# Patient Record
Sex: Male | Born: 1965 | Hispanic: No | Smoking: Former smoker
Health system: Southern US, Community
[De-identification: ages and names within clinical notes are randomized; demographics above are authoritative.]

## PROBLEM LIST (undated history)

## (undated) DIAGNOSIS — E781 Pure hyperglyceridemia: Secondary | ICD-10-CM

## (undated) HISTORY — DX: Pure hyperglyceridemia: E78.1

---

## 1999-11-11 ENCOUNTER — Encounter: Payer: Self-pay | Admitting: Emergency Medicine

## 1999-11-11 ENCOUNTER — Emergency Department (HOSPITAL_COMMUNITY): Admission: EM | Admit: 1999-11-11 | Discharge: 1999-11-11 | Payer: Self-pay | Admitting: Emergency Medicine

## 2004-05-29 ENCOUNTER — Ambulatory Visit (HOSPITAL_COMMUNITY): Admission: RE | Admit: 2004-05-29 | Discharge: 2004-05-29 | Payer: Self-pay | Admitting: General Practice

## 2014-10-15 ENCOUNTER — Other Ambulatory Visit: Payer: Self-pay | Admitting: Orthopedic Surgery

## 2014-10-31 ENCOUNTER — Ambulatory Visit (HOSPITAL_BASED_OUTPATIENT_CLINIC_OR_DEPARTMENT_OTHER)
Admission: RE | Admit: 2014-10-31 | Payer: BLUE CROSS/BLUE SHIELD | Source: Ambulatory Visit | Admitting: Orthopedic Surgery

## 2014-10-31 ENCOUNTER — Encounter (HOSPITAL_BASED_OUTPATIENT_CLINIC_OR_DEPARTMENT_OTHER): Admission: RE | Payer: Self-pay | Source: Ambulatory Visit

## 2014-10-31 SURGERY — EXCISION MASS
Anesthesia: General | Site: Finger | Laterality: Right

## 2015-10-21 ENCOUNTER — Ambulatory Visit (INDEPENDENT_AMBULATORY_CARE_PROVIDER_SITE_OTHER): Payer: BLUE CROSS/BLUE SHIELD

## 2015-10-21 ENCOUNTER — Ambulatory Visit (INDEPENDENT_AMBULATORY_CARE_PROVIDER_SITE_OTHER): Payer: BLUE CROSS/BLUE SHIELD | Admitting: Family Medicine

## 2015-10-21 ENCOUNTER — Encounter: Payer: Self-pay | Admitting: Family Medicine

## 2015-10-21 VITALS — BP 116/70 | HR 65 | Temp 97.9°F | Resp 18 | Ht 70.0 in | Wt 201.8 lb

## 2015-10-21 DIAGNOSIS — R079 Chest pain, unspecified: Secondary | ICD-10-CM

## 2015-10-21 DIAGNOSIS — R002 Palpitations: Secondary | ICD-10-CM

## 2015-10-21 DIAGNOSIS — R0789 Other chest pain: Secondary | ICD-10-CM

## 2015-10-21 DIAGNOSIS — L299 Pruritus, unspecified: Secondary | ICD-10-CM | POA: Diagnosis not present

## 2015-10-21 DIAGNOSIS — R918 Other nonspecific abnormal finding of lung field: Secondary | ICD-10-CM

## 2015-10-21 LAB — CBC
HCT: 45.7 % (ref 38.5–50.0)
HEMOGLOBIN: 15.6 g/dL (ref 13.2–17.1)
MCH: 30.3 pg (ref 27.0–33.0)
MCHC: 34.1 g/dL (ref 32.0–36.0)
MCV: 88.7 fL (ref 80.0–100.0)
MPV: 10.4 fL (ref 7.5–12.5)
PLATELETS: 230 10*3/uL (ref 140–400)
RBC: 5.15 MIL/uL (ref 4.20–5.80)
RDW: 14.1 % (ref 11.0–15.0)
WBC: 6 10*3/uL (ref 3.8–10.8)

## 2015-10-21 LAB — TSH: TSH: 1.82 m[IU]/L (ref 0.40–4.50)

## 2015-10-21 MED ORDER — OMEPRAZOLE 20 MG PO CPDR: 20.0000 mg | DELAYED_RELEASE_CAPSULE | Freq: Every day | ORAL | 0 refills | Status: DC

## 2015-10-21 MED ORDER — AZITHROMYCIN 250 MG PO TABS: ORAL_TABLET | ORAL | 0 refills | Status: DC

## 2015-10-21 NOTE — Patient Instructions (Addendum)
The radiologist did see a possible pneumonia on the left side of your chest. Start antibiotic as discussed, and recheck within the next 1 week. Sooner if any worsening of chest pain.  I would want to make sure that the area seen on x-ray completely resolves after completion of antibiotic, so plan on repeating x-ray in 4-6 weeks. If that area still persist, would recommend CT scan.   See information on chest pain below. The burning sensation may be due to heartburn. I would like you to try omeprazole one per day for now to see if that helps. I also checked some lab work for palpitations or change in heart rate. See information below on this condition.  Return to the clinic or go to the nearest emergency room if any of your symptoms worsen or new symptoms occur.   For your hand itching - can try cortisone over the counter, Aveeno lotion, but recheck in next 2 weeks if this has not helped.    Nonspecific Chest Pain  Chest pain can be caused by many different conditions. There is always a chance that your pain could be related to something serious, such as a heart attack or a blood clot in your lungs. Chest pain can also be caused by conditions that are not life-threatening. If you have chest pain, it is very important to follow up with your health care provider. CAUSES  Chest pain can be caused by:  Heartburn.  Pneumonia or bronchitis.  Anxiety or stress.  Inflammation around your heart (pericarditis) or lung (pleuritis or pleurisy).  A blood clot in your lung.  A collapsed lung (pneumothorax). It can develop suddenly on its own (spontaneous pneumothorax) or from trauma to the chest.  Shingles infection (varicella-zoster virus).  Heart attack.  Damage to the bones, muscles, and cartilage that make up your chest wall. This can include:  Bruised bones due to injury.  Strained muscles or cartilage due to frequent or repeated coughing or overwork.  Fracture to one or more ribs.  Sore  cartilage due to inflammation (costochondritis). RISK FACTORS  Risk factors for chest pain may include:  Activities that increase your risk for trauma or injury to your chest.  Respiratory infections or conditions that cause frequent coughing.  Medical conditions or overeating that can cause heartburn.  Heart disease or family history of heart disease.  Conditions or health behaviors that increase your risk of developing a blood clot.  Having had chicken pox (varicella zoster). SIGNS AND SYMPTOMS Chest pain can feel like:  Burning or tingling on the surface of your chest or deep in your chest.  Crushing, pressure, aching, or squeezing pain.  Dull or sharp pain that is worse when you move, cough, or take a deep breath.  Pain that is also felt in your back, neck, shoulder, or arm, or pain that spreads to any of these areas. Your chest pain may come and go, or it may stay constant. DIAGNOSIS Lab tests or other studies may be needed to find the cause of your pain. Your health care provider may have you take a test called an ambulatory ECG (electrocardiogram). An ECG records your heartbeat patterns at the time the test is performed. You may also have other tests, such as:  Transthoracic echocardiogram (TTE). During echocardiography, sound waves are used to create a picture of all of the heart structures and to look at how blood flows through your heart.  Transesophageal echocardiogram (TEE).This is a more advanced imaging test that obtains  images from inside your body. It allows your health care provider to see your heart in finer detail.  Cardiac monitoring. This allows your health care provider to monitor your heart rate and rhythm in real time.  Holter monitor. This is a portable device that records your heartbeat and can help to diagnose abnormal heartbeats. It allows your health care provider to track your heart activity for several days, if needed.  Stress tests. These can be  done through exercise or by taking medicine that makes your heart beat more quickly.  Blood tests.  Imaging tests. TREATMENT  Your treatment depends on what is causing your chest pain. Treatment may include:  Medicines. These may include:  Acid blockers for heartburn.  Anti-inflammatory medicine.  Pain medicine for inflammatory conditions.  Antibiotic medicine, if an infection is present.  Medicines to dissolve blood clots.  Medicines to treat coronary artery disease.  Supportive care for conditions that do not require medicines. This may include:  Resting.  Applying heat or cold packs to injured areas.  Limiting activities until pain decreases. HOME CARE INSTRUCTIONS  If you were prescribed an antibiotic medicine, finish it all even if you start to feel better.  Avoid any activities that bring on chest pain.  Do not use any tobacco products, including cigarettes, chewing tobacco, or electronic cigarettes. If you need help quitting, ask your health care provider.  Do not drink alcohol.  Take medicines only as directed by your health care provider.  Keep all follow-up visits as directed by your health care provider. This is important. This includes any further testing if your chest pain does not go away.  If heartburn is the cause for your chest pain, you may be told to keep your head raised (elevated) while sleeping. This reduces the chance that acid will go from your stomach into your esophagus.  Make lifestyle changes as directed by your health care provider. These may include:  Getting regular exercise. Ask your health care provider to suggest some activities that are safe for you.  Eating a heart-healthy diet. A registered dietitian can help you to learn healthy eating options.  Maintaining a healthy weight.  Managing diabetes, if necessary.  Reducing stress. SEEK MEDICAL CARE IF:  Your chest pain does not go away after treatment.  You have a rash with  blisters on your chest.  You have a fever. SEEK IMMEDIATE MEDICAL CARE IF:   Your chest pain is worse.  You have an increasing cough, or you cough up blood.  You have severe abdominal pain.  You have severe weakness.  You faint.  You have chills.  You have sudden, unexplained chest discomfort.  You have sudden, unexplained discomfort in your arms, back, neck, or jaw.  You have shortness of breath at any time.  You suddenly start to sweat, or your skin gets clammy.  You feel nauseous or you vomit.  You suddenly feel light-headed or dizzy.  Your heart begins to beat quickly, or it feels like it is skipping beats. These symptoms may represent a serious problem that is an emergency. Do not wait to see if the symptoms will go away. Get medical help right away. Call your local emergency services (911 in the [place] Do not drive yourself to the hospital.   This information is not intended to replace advice given to you by your health care provider. Make sure you discuss any questions you have with your health care provider.   Document Released: 11/17/2004 Document Revised:  02/28/2014 Document Reviewed: 09/13/2013 Elsevier Interactive Patient Education 2016 [employer]   Palpitations A palpitation is the feeling that your heartbeat is irregular or is faster than normal. It may feel like your heart is fluttering or skipping a beat. Palpitations are usually not a serious problem. However, in some cases, you may need further medical evaluation. CAUSES  Palpitations can be caused by:  Smoking.  Caffeine or other stimulants, such as diet pills or energy drinks.  Alcohol.  Stress and anxiety.  Strenuous physical activity.  Fatigue.  Certain medicines.  Heart disease, especially if you have a history of irregular heart rhythms (arrhythmias), such as atrial fibrillation, atrial flutter, or supraventricular tachycardia.  An improperly working pacemaker or  defibrillator. DIAGNOSIS  To find the cause of your palpitations, your health care provider will take your medical history and perform a physical exam. Your health care provider may also have you take a test called an ambulatory electrocardiogram (ECG). An ECG records your heartbeat patterns over a 24-hour period. You may also have other tests, such as:  Transthoracic echocardiogram (TTE). During echocardiography, sound waves are used to evaluate how blood flows through your heart.  Transesophageal echocardiogram (TEE).  Cardiac monitoring. This allows your health care provider to monitor your heart rate and rhythm in real time.  Holter monitor. This is a portable device that records your heartbeat and can help diagnose heart arrhythmias. It allows your health care provider to track your heart activity for several days, if needed.  Stress tests by exercise or by giving medicine that makes the heart beat faster. TREATMENT  Treatment of palpitations depends on the cause of your symptoms and can vary greatly. Most cases of palpitations do not require any treatment other than time, relaxation, and monitoring your symptoms. Other causes, such as atrial fibrillation, atrial flutter, or supraventricular tachycardia, usually require further treatment. HOME CARE INSTRUCTIONS   Avoid:  Caffeinated coffee, tea, soft drinks, diet pills, and energy drinks.  Chocolate.  Alcohol.  Stop smoking if you smoke.  Reduce your stress and anxiety. Things that can help you relax include:  A method of controlling things in your body, such as your heartbeats, with your mind (biofeedback).  Yoga.  Meditation.  Physical activity such as swimming, jogging, or walking.  Get plenty of rest and sleep. SEEK MEDICAL CARE IF:   You continue to have a fast or irregular heartbeat beyond 24 hours.  Your palpitations occur more often. SEEK IMMEDIATE MEDICAL CARE IF:  You have chest pain or shortness of  breath.  You have a severe headache.  You feel dizzy or you faint. MAKE SURE YOU:  Understand these instructions.  Will watch your condition.  Will get help right away if you are not doing well or get worse.   This information is not intended to replace advice given to you by your health care provider. Make sure you discuss any questions you have with your health care provider.   Document Released: 02/05/2000 Document Revised: 02/12/2013 Document Reviewed: 04/08/2011 Elsevier Interactive Patient Education 2016 [employer]   IF you received an x-ray today, you will [facility] an invoice [facility]. Please contact [facility] at [phone] with questions or concerns regarding your invoice.   IF you received labwork today, you will receive an invoice from [facility]. Please contact [facility] at [phone] with questions or concerns regarding your invoice.   Our billing staff will not be able to assist you with questions regarding bills from  these companies.  You will be contacted with the lab results as soon as they are available. The fastest way to get your results is to activate your My Chart account. Instructions are located on the last page of this paperwork. If you have not heard from [place] regarding the results in 2 weeks, please contact this office.

## 2015-10-21 NOTE — Progress Notes (Signed)
By signing my name below, I, [name], attest that this documentation has been prepared under the direction and in the presence of [name].  Electronically Signed: Arvilla Market, Medical Scribe. 10/21/15. 3:47 PM.  Subjective:    Patient ID: Aaron Blanchard, male    DOB: September 28, 1965, 50 y.o.   MRN: [id number]  HPI Chief Complaint  Patient presents with  . Chest Pain    Pain in the Left side of chest x 1 1/2 - 2 months    HPI Comments: Aaron Blanchard is a 50 y.o. male who presents to the [facility] complaining of mild left sided chest pain onset 4 weeks. Pt reports associated symptom of palpitations and describes the pain as "burning and heat" that occasionally last all day. Pt states alcohol worsens his chest pain, and he drinks about 10 drinks on the weekend if he does drink that weekend. Pt was working in [employer] when he felt the pain. Pt mentions he's been stressed from the house he's working on. Pt smokes occasionally, but he used to smoke .5 pack a day when he did smoked heavier. Pt mentions he eats good and eats fiber daily Pain isn't worsened with foods, laying down, or extraneous exercise. Pt works out daily and denies pain with exercise. Pt denies chest injuries. Pt denies pain radiating to his shoulder, back or arm. Pt denies , melena, belching, SOB, dyspnea, or PMHx of reflux.  Pt reports episodic itching and tingling in his left hand. Pt has been using Aveeno that gave little relief to his symptoms.  There are no active problems to display for this patient.  No past medical history on file. No past surgical history on file. No Known Allergies Prior to Admission medications   Not on File   Social History   Social History  . Marital status: Married    Spouse name: N/A  . Number of children: N/A  . Years of education: N/A   Occupational History  . Not on file.   Social History Main Topics  . Smoking status: Former Games developer  .  Smokeless tobacco: Not on file  . Alcohol use Not on file  . Drug use: Unknown  . Sexual activity: Not on file   Other Topics Concern  . Not on file   Social History Narrative  . No narrative on file   Depression screen [facility] 2/9 10/21/2015  Decreased Interest 0  Down, Depressed, Hopeless 0  PHQ - 2 Score 0   Review of Systems  Constitutional: Negative for appetite change.  Respiratory: Negative for cough and shortness of breath.   Cardiovascular: Positive for chest pain and palpitations.  Gastrointestinal: Negative for blood in stool.  Musculoskeletal: Negative for back pain.   Objective:  Physical Exam  Constitutional: He is oriented to person, place, and time. He appears well-developed and well-nourished.  HENT:  Head: Normocephalic and atraumatic.  Eyes: EOM are normal. Pupils are equal, round, and reactive to light.  Neck: No JVD present. Carotid bruit is not present.  Cardiovascular: Normal rate, regular rhythm and normal heart sounds.   No murmur heard. Pulmonary/Chest: Effort normal and breath sounds normal. He has no rales.  Abdominal: He exhibits no distension. There is no tenderness.  Unable to reproduce pain with palpation  Musculoskeletal: He exhibits no edema.  Neurological: He is alert and oriented to person, place, and time.  Skin: Skin is warm and dry.  Left hand: Skin is dry NVI distally No rash  Psychiatric: He has a normal mood and affect.  Vitals reviewed.  BP 116/70 (BP Location: Right Arm, Patient Position: Sitting, Cuff Size: Large)   Pulse 65   Temp 97.9 F (36.6 C) (Oral)   Resp 18   Ht 5\' 10"  (1.778 m)   Wt 201 lb 12.8 oz (91.5 kg)   SpO2 96%   BMI 28.96 kg/m    [EKG Reading]: nl sinus rhythm. no acute findings  Dg Chest 2 View  Result Date: 10/21/2015 CLINICAL DATA:  Left-sided chest pain. EXAM: CHEST  2 VIEW COMPARISON:  No recent prior . FINDINGS: Mediastinum and hilar structures are normal. Lingular subsegmental atelectasis and or  infiltrate noted. No pleural effusion or pneumothorax. Heart size normal. No acute bony abnormality IMPRESSION: Lingular subsegmental atelectasis and or infiltrate. Lingular pneumonia cannot be excluded. Follow-up PA lateral chest x-ray suggested to demonstrate clearing. Electronically Signed   By: [name]  Register   On: 10/21/2015 16:32   Assessment & Plan:    Aaron Blanchard is a 50 y.o. male Left sided chest pain - Plan: EKG 12-Lead, DG Chest 2 View, azithromycin (ZITHROMAX) 250 MG tablet Palpitations - Plan: CBC, TSH, EKG 12-Lead Lung infiltrate - Plan: azithromycin (ZITHROMAX) 250 MG tablet Burning in the chest  - left sided chest pain for the past 1-2 months. Burning sensation, so possible reflux, but infiltrate noted on chest x-ray versus atelectasis.  -Start azithromycin for possible infectious cause, but plan on repeat x-ray to make sure this area clears within the next 4-6 weeks. If persistent, consider CT chest  - Doubt cardiac chest pain as no symptoms with exercise, and reassuring EKG.   - check TSH and CBC with his palpitations, but if these persist, consider cardiology eval. Follow-up in one week, sooner if worse.  - ER precautions discussed.  Meds ordered this encounter  Medications  . azithromycin (ZITHROMAX) 250 MG tablet    Sig: Take 2 pills by mouth on day 1, then 1 pill by mouth per day on days 2 through 5.    Dispense:  6 tablet    Refill:  0  . omeprazole (PRILOSEC) 20 MG capsule    Sig: Take 1 capsule (20 mg total) by mouth daily.    Dispense:  30 capsule    Refill:  0   Patient Instructions   The radiologist did see a possible pneumonia on the left side of your chest. Start antibiotic as discussed, and recheck within the next 1 week. Sooner if any worsening of chest pain.  I would want to make sure that the area seen on x-ray completely resolves after completion of antibiotic, so plan on repeating x-ray in 4-6 weeks. If that area still persist, would recommend CT  scan.   See information on chest pain below. The burning sensation may be due to heartburn. I would like you to try omeprazole one per day for now to see if that helps. I also checked some lab work for palpitations or change in heart rate. See information below on this condition.  Return to the clinic or go to the nearest emergency room if any of your symptoms worsen or new symptoms occur.   For your hand itching - can try cortisone over the counter, Aveeno lotion, but recheck in next 2 weeks if this has not helped.    Nonspecific Chest Pain  Chest pain can be caused by many different conditions. There is always a chance that your pain could be related to something serious, such  as a heart attack or a blood clot in your lungs. Chest pain can also be caused by conditions that are not life-threatening. If you have chest pain, it is very important to follow up with your health care provider. CAUSES  Chest pain can be caused by:  Heartburn.  Pneumonia or bronchitis.  Anxiety or stress.  Inflammation around your heart (pericarditis) or lung (pleuritis or pleurisy).  A blood clot in your lung.  A collapsed lung (pneumothorax). It can develop suddenly on its own (spontaneous pneumothorax) or from trauma to the chest.  Shingles infection (varicella-zoster virus).  Heart attack.  Damage to the bones, muscles, and cartilage that make up your chest wall. This can include:  Bruised bones due to injury.  Strained muscles or cartilage due to frequent or repeated coughing or overwork.  Fracture to one or more ribs.  Sore cartilage due to inflammation (costochondritis). RISK FACTORS  Risk factors for chest pain may include:  Activities that increase your risk for trauma or injury to your chest.  Respiratory infections or conditions that cause frequent coughing.  Medical conditions or overeating that can cause heartburn.  Heart disease or family history of heart disease.  Conditions  or health behaviors that increase your risk of developing a blood clot.  Having had chicken pox (varicella zoster). SIGNS AND SYMPTOMS Chest pain can feel like:  Burning or tingling on the surface of your chest or deep in your chest.  Crushing, pressure, aching, or squeezing pain.  Dull or sharp pain that is worse when you move, cough, or take a deep breath.  Pain that is also felt in your back, neck, shoulder, or arm, or pain that spreads to any of these areas. Your chest pain may come and go, or it may stay constant. DIAGNOSIS Lab tests or other studies may be needed to find the cause of your pain. Your health care provider may have you take a test called an ambulatory ECG (electrocardiogram). An ECG records your heartbeat patterns at the time the test is performed. You may also have other tests, such as:  Transthoracic echocardiogram (TTE). During echocardiography, sound waves are used to create a picture of all of the heart structures and to look at how blood flows through your heart.  Transesophageal echocardiogram (TEE).This is a more advanced imaging test that obtains images from inside your body. It allows your health care provider to see your heart in finer detail.  Cardiac monitoring. This allows your health care provider to monitor your heart rate and rhythm in real time.  Holter monitor. This is a portable device that records your heartbeat and can help to diagnose abnormal heartbeats. It allows your health care provider to track your heart activity for several days, if needed.  Stress tests. These can be done through exercise or by taking medicine that makes your heart beat more quickly.  Blood tests.  Imaging tests. TREATMENT  Your treatment depends on what is causing your chest pain. Treatment may include:  Medicines. These may include:  Acid blockers for heartburn.  Anti-inflammatory medicine.  Pain medicine for inflammatory conditions.  Antibiotic medicine,  if an infection is present.  Medicines to dissolve blood clots.  Medicines to treat coronary artery disease.  Supportive care for conditions that do not require medicines. This may include:  Resting.  Applying heat or cold packs to injured areas.  Limiting activities until pain decreases. HOME CARE INSTRUCTIONS  If you were prescribed an antibiotic medicine, finish it all even  if you start to feel better.  Avoid any activities that bring on chest pain.  Do not use any tobacco products, including cigarettes, chewing tobacco, or electronic cigarettes. If you need help quitting, ask your health care provider.  Do not drink alcohol.  Take medicines only as directed by your health care provider.  Keep all follow-up visits as directed by your health care provider. This is important. This includes any further testing if your chest pain does not go away.  If heartburn is the cause for your chest pain, you may be told to keep your head raised (elevated) while sleeping. This reduces the chance that acid will go from your stomach into your esophagus.  Make lifestyle changes as directed by your health care provider. These may include:  Getting regular exercise. Ask your health care provider to suggest some activities that are safe for you.  Eating a heart-healthy diet. A registered dietitian can help you to learn healthy eating options.  Maintaining a healthy weight.  Managing diabetes, if necessary.  Reducing stress. SEEK MEDICAL CARE IF:  Your chest pain does not go away after treatment.  You have a rash with blisters on your chest.  You have a fever. SEEK IMMEDIATE MEDICAL CARE IF:   Your chest pain is worse.  You have an increasing cough, or you cough up blood.  You have severe abdominal pain.  You have severe weakness.  You faint.  You have chills.  You have sudden, unexplained chest discomfort.  You have sudden, unexplained discomfort in your arms, back, neck,  or jaw.  You have shortness of breath at any time.  You suddenly start to sweat, or your skin gets clammy.  You feel nauseous or you vomit.  You suddenly feel light-headed or dizzy.  Your heart begins to beat quickly, or it feels like it is skipping beats. These symptoms may represent a serious problem that is an emergency. Do not wait to see if the symptoms will go away. Get medical help right away. Call your local emergency services (911 in the [place] Do not drive yourself to the hospital.   This information is not intended to replace advice given to you by your health care provider. Make sure you discuss any questions you have with your health care provider.   Document Released: 11/17/2004 Document Revised: 02/28/2014 Document Reviewed: 09/13/2013 Elsevier Interactive Patient Education 2016 [employer].   Palpitations A palpitation is the feeling that your heartbeat is irregular or is faster than normal. It may feel like your heart is fluttering or skipping a beat. Palpitations are usually not a serious problem. However, in some cases, you may need further medical evaluation. CAUSES  Palpitations can be caused by:  Smoking.  Caffeine or other stimulants, such as diet pills or energy drinks.  Alcohol.  Stress and anxiety.  Strenuous physical activity.  Fatigue.  Certain medicines.  Heart disease, especially if you have a history of irregular heart rhythms (arrhythmias), such as atrial fibrillation, atrial flutter, or supraventricular tachycardia.  An improperly working pacemaker or defibrillator. DIAGNOSIS  To find the cause of your palpitations, your health care provider will take your medical history and perform a physical exam. Your health care provider may also have you take a test called an ambulatory electrocardiogram (ECG). An ECG records your heartbeat patterns over a 24-hour period. You may also have other tests, such as:  Transthoracic echocardiogram (TTE).  During echocardiography, sound waves are used to evaluate how blood flows through your  heart.  Transesophageal echocardiogram (TEE).  Cardiac monitoring. This allows your health care provider to monitor your heart rate and rhythm in real time.  Holter monitor. This is a portable device that records your heartbeat and can help diagnose heart arrhythmias. It allows your health care provider to track your heart activity for several days, if needed.  Stress tests by exercise or by giving medicine that makes the heart beat faster. TREATMENT  Treatment of palpitations depends on the cause of your symptoms and can vary greatly. Most cases of palpitations do not require any treatment other than time, relaxation, and monitoring your symptoms. Other causes, such as atrial fibrillation, atrial flutter, or supraventricular tachycardia, usually require further treatment. HOME CARE INSTRUCTIONS   Avoid:  Caffeinated coffee, tea, soft drinks, diet pills, and energy drinks.  Chocolate.  Alcohol.  Stop smoking if you smoke.  Reduce your stress and anxiety. Things that can help you relax include:  A method of controlling things in your body, such as your heartbeats, with your mind (biofeedback).  Yoga.  Meditation.  Physical activity such as swimming, jogging, or walking.  Get plenty of rest and sleep. SEEK MEDICAL CARE IF:   You continue to have a fast or irregular heartbeat beyond 24 hours.  Your palpitations occur more often. SEEK IMMEDIATE MEDICAL CARE IF:  You have chest pain or shortness of breath.  You have a severe headache.  You feel dizzy or you faint. MAKE SURE YOU:  Understand these instructions.  Will watch your condition.  Will get help right away if you are not doing well or get worse.   This information is not intended to replace advice given to you by your health care provider. Make sure you discuss any questions you have with your health care provider.     Document Released: 02/05/2000 Document Revised: 02/12/2013 Document Reviewed: 04/08/2011 Elsevier Interactive Patient Education 2016 [employer]   IF you received an x-ray today, you will receive an invoice from [facility]. Please contact [facility] at [phone] with questions or concerns regarding your invoice.   IF you received labwork today, you will receive an invoice from [facility]. Please contact [facility] at [phone] with questions or concerns regarding your invoice.   Our billing staff will not be able to assist you with questions regarding bills from these companies.  You will be contacted with the lab results as soon as they are available. The fastest way to get your results is to activate your My Chart account. Instructions are located on the last page of this paperwork. If you have not heard from us regarding the results in 2 weeks, please contact this office.        I personally performed the services described in this documentation, which was scribed in my presence. The recorded information has been reviewed and considered, and addended by me as needed.   Signed,   Meredith StaggersJeffrey Jejuan Scala, MD [facility].  10/21/15 6:33 PM

## 2015-10-29 ENCOUNTER — Ambulatory Visit (INDEPENDENT_AMBULATORY_CARE_PROVIDER_SITE_OTHER): Payer: BLUE CROSS/BLUE SHIELD | Admitting: Family Medicine

## 2015-10-29 VITALS — BP 116/78 | HR 70 | Temp 97.9°F | Resp 17 | Ht 70.0 in | Wt 204.0 lb

## 2015-10-29 DIAGNOSIS — M26621 Arthralgia of right temporomandibular joint: Secondary | ICD-10-CM | POA: Diagnosis not present

## 2015-10-29 DIAGNOSIS — R1012 Left upper quadrant pain: Secondary | ICD-10-CM

## 2015-10-29 DIAGNOSIS — H6121 Impacted cerumen, right ear: Secondary | ICD-10-CM | POA: Diagnosis not present

## 2015-10-29 DIAGNOSIS — Z8701 Personal history of pneumonia (recurrent): Secondary | ICD-10-CM

## 2015-10-29 DIAGNOSIS — R0789 Other chest pain: Secondary | ICD-10-CM

## 2015-10-29 DIAGNOSIS — G4489 Other headache syndrome: Secondary | ICD-10-CM | POA: Diagnosis not present

## 2015-10-29 MED ORDER — TRAMADOL HCL 50 MG PO TABS: 50.0000 mg | ORAL_TABLET | Freq: Three times a day (TID) | ORAL | 0 refills | Status: DC | PRN

## 2015-10-29 NOTE — Patient Instructions (Addendum)
It was good to see you today  We have cleaned out your ear.  Take the Tramadol for pain relief of headaches and jaw pain.  Continue taking the Omeprazole for your stomach.  This should help clear up the pain you are having.      IF you received an x-ray today, you will receive an invoice from [facility]. Please contact [facility] at [phone] with questions or concerns regarding your invoice.   IF you received labwork today, you will receive an invoice from [facility]. Please contact [facility] at [phone] with questions or concerns regarding your invoice.   Our billing staff will not be able to assist you with questions regarding bills from these companies.  You will be contacted with the lab results as soon as they are available. The fastest way to get your results is to activate your My Chart account. Instructions are located on the last page of this paperwork. If you have not heard from us regarding the results in 2 weeks, please contact this office.

## 2015-10-29 NOTE — Progress Notes (Signed)
Aaron Blanchard is a 50 y.o. male who presents to [facility] today for chest pain:  1.  Chest pain:  Patient seen a week ago for chest pain. Had negative EKG at that time. Had chest x-ray which showed pneumonia. He was treated with antibiotics but states his pain is continued. He describes this as a gnawing or boring sensation in his left upper quadrant/several inches below his left nipple. He works in [employer] and works out 5 days a week. He does not have chest pain or pressure or dyspnea when he is working out or working [employer]. He notes the pain mostly when he is sitting still. He is unsure if this is worsened by food. No nausea vomiting. He does have history of what sounds to be melena but this is fairly inconsistent and has not had this in the past several weeks. Denies any hematochezia.  He's had no cough since being seen last time. No dyspnea. No fevers or chills. States he feels extremely well.  2.  Right jaw pain:  Started 2 hours ago while he was in the waiting room. Jaw pain when he tries to open his jaw wide. Located right in front of right ear. Has never had this pain before. No trauma. No injury.  3.  Chronic headaches:  Present for the past several months to years. Describes what is usually unilateral headache. Not accompanied by nausea. Occasionally accompanied by dizziness which he describes as vertiginous type symptoms. Headache can occur once every 1-2 weeks. Father has history of migraines. Currently headache is right-sided. Started earlier this morning. He has not tried anything for pain relief. No change in vision.  ROS as above.    PMH reviewed. Patient is a nonsmoker.   No past medical history on file. No past surgical history on file.  Medications reviewed. Current Outpatient Prescriptions  Medication Sig Dispense Refill  . omeprazole (PRILOSEC) 20 MG capsule Take 1 capsule (20 mg total) by mouth daily. 30 capsule 0   No current  facility-administered medications for this visit.      Physical Exam:  BP 116/78 (BP Location: Right Arm, Patient Position: Sitting, Cuff Size: Large)   Pulse 70   Temp 97.9 F (36.6 C) (Oral)   Resp 17   Ht 5\' 10"  (1.778 m)   Wt 204 lb (92.5 kg)   SpO2 98%   BMI 29.27 kg/m  Gen:  Alert, cooperative patient who appears stated age in no acute distress.  Vital signs reviewed. HEENT: EOMI,  MMM. No papilledema noted. Tenderness right at TMJ on right side. He is able to open his mouth but halfway before he begins to have pain. I also note what appears to possibly be a bug or maybe just cerumen with hair in his right canal. Neck: Supple with no adenopathy Pulm:  Clear to auscultation bilaterally with good air movement.  No wheezes or rales noted.   Cardiac:  Regular rate and rhythm without murmur auscultated.  Good S1/S2. Abd:  Soft/nondistended. Minimal left upper quadrant tenderness on exam. Rest of abdomen is benign. Exts: Non edematous BL  LE, warm and well perfused.  Neuro:  Alert and oriented to person, place, and date.  CN II-XII intact.  No focal deficits noted.  Strength and sensation are 5 out of 5 throughout. Romberg negative.   Assessment and Plan:  1.  Gastritis: -Most likely diagnosis of left upper quadrant pain. -No evidence of chest pain. -His pneumonia appears completely resolved- -  He does have questionable history of melena in the past. He could have a ulcer and left upper quadrant. We did discuss this issue continue take his omeprazole. He should be reevaluated after finishing this to see if the pain continues. If he continues to have pain despite urging of use of PPI I would recommend referring him to gastroenterology for possible endoscopy. -Presented very low risk for cardiac pain as he is very active without any sort of typical chest pain. Patient was relieved after he went over his past x-ray, labs, EKG  #2. Pneumonia: -He has finished is  antibiotics. -Cleared  3. TMJ pain: -Tramadol for pain relief -Surprising this occurred so suddenly. -Do not find anything on exam except for pain. He denies any history of locking or clicking in his joint   4. Headache: -Sounds possibly like history of migraine. -This is been going on for the past several months to years, patient is unclear exactly how long. -Family history of migraine. -Treat with tramadol today. -If this does not help or pain returns/recurs would recommend attempting Imitrex. Tramadol will also help his TMJ pain - no red flags  5.  Cerumen impaction:   - Noted incidentally on exam. Ear irrigation completed today -He did not have a bug in his ear.  45 minutes face time spent with patient reviewing his symptoms, performing history, and going over his recent visit with him.

## 2018-02-21 HISTORY — PX: MINOR CARPAL TUNNEL: SHX6472

## 2018-06-06 ENCOUNTER — Other Ambulatory Visit: Payer: Self-pay

## 2018-06-06 ENCOUNTER — Encounter (HOSPITAL_COMMUNITY): Payer: Self-pay

## 2018-06-06 ENCOUNTER — Emergency Department (HOSPITAL_COMMUNITY)
Admission: EM | Admit: 2018-06-06 | Discharge: 2018-06-06 | Disposition: A | Discharge status: Home / Self Care | Payer: No Typology Code available for payment source | Attending: Emergency Medicine | Admitting: Emergency Medicine

## 2018-06-06 DIAGNOSIS — Z87891 Personal history of nicotine dependence: Secondary | ICD-10-CM | POA: Insufficient documentation

## 2018-06-06 DIAGNOSIS — M79643 Pain in unspecified hand: Secondary | ICD-10-CM | POA: Diagnosis present

## 2018-06-06 DIAGNOSIS — G5603 Carpal tunnel syndrome, bilateral upper limbs: Secondary | ICD-10-CM | POA: Diagnosis not present

## 2018-06-06 MED ORDER — HYDROCODONE-ACETAMINOPHEN 5-325 MG PO TABS
2.0000 | ORAL_TABLET | Freq: Once | ORAL | Status: AC
  Administered 2018-06-06: 2 via ORAL
  Filled 2018-06-06: qty 2

## 2018-06-06 MED ORDER — NAPROXEN 500 MG PO TABS: 500.0000 mg | ORAL_TABLET | Freq: Once | ORAL | Status: DC

## 2018-06-06 MED ORDER — NAPROXEN 500 MG PO TABS: 500.0000 mg | ORAL_TABLET | Freq: Two times a day (BID) | ORAL | 0 refills | Status: DC

## 2018-06-06 NOTE — ED Notes (Signed)
Reviewed departure paperwork with patient. Computer in room is down and signature pad not available on mobile cart.

## 2018-06-06 NOTE — ED Triage Notes (Addendum)
Pt reports shooting pain in his bilateral hands. States that it is worse with movement and worse on the R side. He states that the pain shoots up his arms. Denies injury. States that he has tried ibuprofen without relief.

## 2018-06-06 NOTE — ED Notes (Signed)
Pt reports bilateral hand pain that started 3 weeks. 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

## 2018-06-06 NOTE — ED Notes (Signed)
Applied bilateral splints. Ryno-Lacer Long [id number] and [phone]

## 2018-06-06 NOTE — ED Provider Notes (Signed)
[facility]-EMERGENCY DEPT Provider Note: Lowella DellJ. 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  He complains of 3 weeks of pain in his bilateral wrist.  The pain is primarily located over the carpal tunnel bilaterally.  He describes the pain is shooting.  It radiates to his hands as well as proximally to his forearms.  It is worse with palpation or movement.  He rates it as severe.  He has been taking ibuprofen twice daily without adequate relief.   History reviewed. No pertinent past medical history.  History reviewed. No pertinent surgical history.  Family History  Problem Relation Age of Onset  . Diabetes Mother     Social History   Tobacco Use  . Smoking status: Former Smoker  Substance Use Topics  . Alcohol use: Not on file  . Drug use: Not on file    Prior to Admission medications   Medication Sig Start Date End Date Taking? Authorizing Provider  omeprazole (PRILOSEC) 20 MG capsule Take 1 capsule (20 mg total) by mouth daily. 10/21/15   Shade FloodGreene, Jeffrey R, MD  traMADol (ULTRAM) 50 MG tablet Take 1 tablet (50 mg total) by mouth every 8 (eight) hours as needed. 10/29/15   Tobey GrimWalden, Jeffrey H, MD    Allergies Patient has no known allergies.   REVIEW OF SYSTEMS  Negative except as noted here or in the History of Present Illness.   PHYSICAL EXAMINATION  Initial Vital Signs Blood pressure (!) 156/101, pulse 80, temperature 97.9 F (36.6 C), temperature source Oral, resp. rate 20, height 5\' 11"  (1.803 m), weight 99.8 kg, SpO2 97 %.  Examination General: Well-developed, well-nourished male in no acute distress; appearance consistent with age of record HENT: normocephalic; atraumatic Eyes: Normal appearance Neck: supple Heart: regular rate and rhythm Lungs:  clear to auscultation bilaterally Abdomen: soft; nondistended; nontender; bowel sounds present Extremities: No deformity; positive Tyndale's test bilaterally; positive Phalen's test bilaterally; decreased range of motion of wrists due to pain; no erythema or warmth of hands or wrists Neurologic: Awake, alert and oriented; motor function intact in all extremities and symmetric; no facial droop Skin: Warm and dry Psychiatric: Normal mood and affect   RESULTS  Summary of this visit's results, reviewed by myself:   EKG Interpretation  Date/Time:    Ventricular Rate:    PR Interval:    QRS Duration:   QT Interval:    QTC Calculation:   R Axis:     Text Interpretation:        Laboratory Studies: No results found for this or any previous visit (from the past 24 hour(s)). Imaging Studies: No results found.  ED COURSE and MDM  Nursing notes and initial vitals signs, including pulse oximetry, reviewed.  Vitals:   06/06/18 2312  BP: (!) 156/101  Pulse: 80  Resp: 20  Temp: 97.9 F (36.6 C)  TempSrc: Oral  SpO2: 97%  Weight: 99.8 kg  Height: 5\' 11"  (1.803 m)   History and examination consistent with carpal tunnel syndrome.  We will splint him bilaterally, place him on an NSAID and refer to hand surgery.  PROCEDURES    ED DIAGNOSES     ICD-10-CM   1. Carpal tunnel syndrome on both sides G56.03  Delford Wingert, Jonny Ruiz, MD 06/06/18 [phone]

## 2019-07-20 ENCOUNTER — Other Ambulatory Visit: Payer: Self-pay

## 2019-07-20 ENCOUNTER — Emergency Department (HOSPITAL_COMMUNITY): Payer: Self-pay

## 2019-07-20 ENCOUNTER — Emergency Department (HOSPITAL_COMMUNITY)
Admission: EM | Admit: 2019-07-20 | Discharge: 2019-07-20 | Disposition: A | Discharge status: Home / Self Care | Payer: Self-pay | Attending: Emergency Medicine | Admitting: Emergency Medicine

## 2019-07-20 DIAGNOSIS — Z20822 Contact with and (suspected) exposure to covid-19: Secondary | ICD-10-CM

## 2019-07-20 DIAGNOSIS — R519 Headache, unspecified: Secondary | ICD-10-CM | POA: Insufficient documentation

## 2019-07-20 DIAGNOSIS — R42 Dizziness and giddiness: Secondary | ICD-10-CM | POA: Insufficient documentation

## 2019-07-20 DIAGNOSIS — R0789 Other chest pain: Secondary | ICD-10-CM | POA: Insufficient documentation

## 2019-07-20 DIAGNOSIS — R079 Chest pain, unspecified: Secondary | ICD-10-CM

## 2019-07-20 DIAGNOSIS — R5383 Other fatigue: Secondary | ICD-10-CM | POA: Insufficient documentation

## 2019-07-20 LAB — URINALYSIS, ROUTINE W REFLEX MICROSCOPIC
Bilirubin Urine: NEGATIVE
Glucose, UA: NEGATIVE mg/dL
Hgb urine dipstick: NEGATIVE
Ketones, ur: NEGATIVE mg/dL
Leukocytes,Ua: NEGATIVE
Nitrite: NEGATIVE
Protein, ur: NEGATIVE mg/dL
Specific Gravity, Urine: 1.021 (ref 1.005–1.030)
pH: 5 (ref 5.0–8.0)

## 2019-07-20 LAB — BASIC METABOLIC PANEL
Anion gap: 9 (ref 5–15)
BUN: 17 mg/dL (ref 6–20)
CO2: 23 mmol/L (ref 22–32)
Calcium: 9.1 mg/dL (ref 8.9–10.3)
Chloride: 104 mmol/L (ref 98–111)
Creatinine, Ser: 1.09 mg/dL (ref 0.61–1.24)
GFR calc Af Amer: >60 mL/min (ref >60)
GFR calc non Af Amer: >60 mL/min (ref >60)
Glucose, Bld: 131 mg/dL — ABNORMAL HIGH (ref 70–99)
Potassium: 3.8 mmol/L (ref 3.5–5.1)
Sodium: 136 mmol/L (ref 135–145)

## 2019-07-20 LAB — TROPONIN I (HIGH SENSITIVITY)
Troponin I (High Sensitivity): 2 ng/L (ref <18)
Troponin I (High Sensitivity): 2 ng/L (ref <18)

## 2019-07-20 LAB — CBC
HCT: 45.6 % (ref 39.0–52.0)
Hemoglobin: 15.6 g/dL (ref 13.0–17.0)
MCH: 30 pg (ref 26.0–34.0)
MCHC: 34.2 g/dL (ref 30.0–36.0)
MCV: 87.7 fL (ref 80.0–100.0)
Platelets: 240 10*3/uL (ref 150–400)
RBC: 5.2 MIL/uL (ref 4.22–5.81)
RDW: 13 % (ref 11.5–15.5)
WBC: 6.3 10*3/uL (ref 4.0–10.5)
nRBC: 0 % (ref 0.0–0.2)

## 2019-07-20 LAB — POC SARS CORONAVIRUS 2 AG -  ED: SARS Coronavirus 2 Ag: NEGATIVE

## 2019-07-20 LAB — D-DIMER, QUANTITATIVE: D-Dimer, Quant: 0.79 ug/mL-FEU — ABNORMAL HIGH (ref 0.00–0.50)

## 2019-07-20 MED ORDER — SODIUM CHLORIDE (PF) 0.9 % IJ SOLN
INTRAMUSCULAR | Status: AC
  Filled 2019-07-20: qty 50

## 2019-07-20 MED ORDER — IOHEXOL 350 MG/ML SOLN
100.0000 mL | Freq: Once | INTRAVENOUS | Status: AC | PRN
  Administered 2019-07-20: 100 mL via INTRAVENOUS

## 2019-07-20 MED ORDER — SODIUM CHLORIDE 0.9% FLUSH: 3.0000 mL | Freq: Once | INTRAVENOUS | Status: DC

## 2019-07-20 NOTE — ED Triage Notes (Signed)
Per patient, he has had chest pain since Tuesday. Pain rated 6/10. Patient went to doctor and was referred to cardiologist. Patient says the cardiologist hasn't reached out for an appointment yet. EKG in triage NSR .

## 2019-07-20 NOTE — ED Notes (Signed)
Saved blue and dark green in main lab

## 2019-07-20 NOTE — ED Provider Notes (Signed)
[facility]-EMERGENCY DEPT Provider Note   CSN: [id number] Arrival date & time: 07/20/19  1420     History Chief Complaint  Patient presents with  . Chest Pain    Aaron Blanchard is a 54 y.o. male who presents to the ED today with complaint of gradual onset, constant, stabbing, left sided chest pain x 4-5 days. Pt also complains of a mild headache and lightheadedness. He was seen by his PCP yesterday for same; had labwork done including EKG, CBC, CMP, TSH. EKG without ischemic changes. Labwork insignificant. Pt was prescribed omeprazole as well as hydroxyzine for sleep. He reports taking the omeprazole without any relief of his pain - per pt PCP was placing a referral to cardiology however pt was concerned with it being a long weekend prompting him to come to the ED today. He does mention he got his 1st COVID vaccine yesterday at the pharmacy after picking up his medications. He denies any recent sick contacts or COVID 19 positive exposure. Pt states he feels very fatigued however this started 4-5 days ago prior to the vaccine. Has not noticed anything that makes the chest pain better or worse. Denies fevers, chills, cough, hemoptysis, nausea, vomiting, diaphoresis, unilateral leg swelling, speech changes, confusion, weakness/numbness, neck stiffness, rash, or any other associated symptoms. Pt reports 20 year smoking hx - quit 3 years ago. No family history of CAD. No recent prolonged travel or immobilization. No hx of DVT/PE. No hemoptysis. No active malignancy. No exogenous hormone use.   The history is provided by the patient and medical records.       No past medical history on file.  There are no problems to display for this patient.   No past surgical history on file.     Family History  Problem Relation Age of Onset  . Diabetes Mother     Social History   Tobacco Use  . Smoking status: Former Smoker  Substance Use Topics  . Alcohol use: Not on file    . Drug use: Not on file    Home Medications Prior to Admission medications   Medication Sig Start Date End Date Taking? Authorizing Provider  aspirin EC 81 MG tablet Take 81 mg by mouth daily as needed for moderate pain.   Yes [provider]  naproxen (NAPROSYN) 500 MG tablet Take 1 tablet (500 mg total) by mouth 2 (two) times daily with a meal. Patient not taking: Reported on 07/20/2019 06/06/18   Molpus, Jonny Ruiz, MD    Allergies    Patient has no known allergies.  Review of Systems   Review of Systems  Constitutional: Negative for chills and fever.  Eyes: Negative for visual disturbance.  Respiratory: Negative for cough and shortness of breath.   Cardiovascular: Positive for chest pain.  Gastrointestinal: Negative for abdominal pain, constipation, diarrhea, nausea and vomiting.  Musculoskeletal: Negative for neck pain and neck stiffness.  Skin: Negative for rash.  Neurological: Positive for light-headedness and headaches. Negative for speech difficulty, weakness and numbness.  All other systems reviewed and are negative.   Physical Exam Updated Vital Signs BP 129/87 (BP Location: Left Arm)   Pulse 80   Temp 98.8 F (37.1 C) (Oral)   Resp 18   Ht 5\' 11"  (1.803 m)   Wt 99.8 kg   SpO2 98%   BMI 30.68 kg/m   Physical Exam Vitals and nursing note reviewed.  Constitutional:      Appearance: He is not ill-appearing or diaphoretic.  HENT:     Head: Normocephalic and atraumatic.  Eyes:     Extraocular Movements: Extraocular movements intact.     Conjunctiva/sclera: Conjunctivae normal.     Pupils: Pupils are equal, round, and reactive to light.     Comments: No nystagmus.   Neck:     Meningeal: Brudzinski's sign and Kernig's sign absent.  Cardiovascular:     Rate and Rhythm: Normal rate and regular rhythm.     Pulses:          Radial pulses are 2+ on the right side and 2+ on the left side.       Dorsalis pedis pulses are 2+ on the right side and 2+ on the left  side.  Pulmonary:     Effort: Pulmonary effort is normal.     Breath sounds: Normal breath sounds. No decreased breath sounds, wheezing, rhonchi or rales.  Chest:     Chest wall: Tenderness present.  Abdominal:     Palpations: Abdomen is soft.     Tenderness: There is no abdominal tenderness.  Musculoskeletal:     Cervical back: Neck supple.  Skin:    General: Skin is warm and dry.  Neurological:     Mental Status: He is alert.     Comments: CN 3-12 grossly intact A&O x4 GCS 15 Sensation and strength intact Gait nonataxic including with tandem walking Coordination with finger-to-nose WNL Neg romberg, neg pronator drift     ED Results / Procedures / Treatments   Labs (all labs ordered are listed, but only abnormal results are displayed) Labs Reviewed  BASIC METABOLIC PANEL - Abnormal; Notable for the following components:      Result Value   Glucose, Bld 131 (*)    All other components within normal limits  D-DIMER, QUANTITATIVE (NOT AT [facility]) - Abnormal; Notable for the following components:   D-Dimer, Quant 0.79 (*)    All other components within normal limits  SARS CORONAVIRUS 2 (TAT 6-24 HRS)  CBC  URINALYSIS, ROUTINE W REFLEX MICROSCOPIC  POC SARS CORONAVIRUS 2 AG -  ED  TROPONIN I (HIGH SENSITIVITY)  TROPONIN I (HIGH SENSITIVITY)    EKG None  Radiology DG Chest 2 View  Result Date: 07/20/2019 CLINICAL DATA:  Chest pain EXAM: CHEST - 2 VIEW COMPARISON:  A 32,017 FINDINGS: There is persistent atelectasis versus scarring in the lingula. There is no pneumothorax. No pleural effusion. The heart size is stable. There is no acute osseous abnormality. IMPRESSION: No active cardiopulmonary disease. Electronically Signed   By: Katherine Mantle M.D.   On: 07/20/2019 14:56   CT Angio Chest PE W/Cm &/Or Wo Cm  Result Date: 07/20/2019 CLINICAL DATA:  Chest pain EXAM: CT ANGIOGRAPHY CHEST WITH CONTRAST TECHNIQUE: Multidetector CT imaging of the chest was performed using  the standard protocol during bolus administration of intravenous contrast. Multiplanar CT image reconstructions and MIPs were obtained to evaluate the vascular anatomy. CONTRAST:  OMNIPAQUE IOHEXOL 350 MG/ML SOLN COMPARISON:  Chest radiograph Jul 20, 2019 FINDINGS: Cardiovascular: There is no demonstrable pulmonary embolus. There is no thoracic aortic aneurysm. No thoracic aortic dissection evident; note that the contrast bolus in the aorta is less than optimal for potential dissection assessment. Visualized great vessels appear unremarkable. There are foci of aortic atherosclerosis. No pericardial effusion or pericardial thickening. Mediastinum/Nodes: Thyroid appears unremarkable. No adenopathy evident. No esophageal lesions. Lungs/Pleura: There is patchy atelectasis in the lung bases as well as in the inferior lingular region. No appreciable airspace consolidation  or pleural effusions. Upper Abdomen: Visualized upper abdominal structures appear unremarkable. Musculoskeletal: There are foci of degenerative change in the thoracic spine. No blastic or lytic bone lesions. No chest wall lesions evident. Review of the MIP images confirms the above findings. IMPRESSION: 1. No demonstrable pulmonary embolus. No thoracic aortic aneurysm. No dissection evident. Note that the contrast bolus in the aorta is less than optimal for dissection assessment. There is aortic atherosclerosis. 2. Areas of patchy atelectasis bilaterally. No edema or airspace opacity. No pleural effusions. 3.  No evident adenopathy. Aortic Atherosclerosis (ICD10-I70.0). Electronically Signed   By: Bretta Bang III M.D.   On: 07/20/2019 19:39    Procedures Procedures (including critical care time)  Medications Ordered in ED Medications  sodium chloride flush (NS) 0.9 % injection 3 mL (has no administration in time range)  sodium chloride (PF) 0.9 % injection (has no administration in time range)  iohexol (OMNIPAQUE) 350 MG/ML  injection 100 mL (100 mLs Intravenous Contrast Given 07/20/19 1924)    ED Course  I have reviewed the triage vital signs and the nursing notes.  Pertinent labs & imaging results that were available during my care of the patient were reviewed by me and considered in my medical decision making (see chart for details).  Clinical Course as of Jul 19 2017  Sat Jul 20, 2019  1819 SARS Coronavirus 2 Ag: NEGATIVE [MV]  1905 D-Dimer, Quant(!): 0.79 [MV]    Clinical Course User Index [MV] Tanda Rockers, PA-C   MDM Rules/Calculators/A&P                      54 year old male who presents to the ED today complaining of left-sided chest pain as well as headache and lightheadedness for the past 4 to 5 days.  Saw PCP yesterday with work-up including EKG and lab work which was insignificant, placing referral to cardiology however patient concerned prompting him to come to the ED today.  She was prescribed omeprazole which he states has not relieved his symptoms.  Arrival to the ED patient is afebrile, nontachycardic nontachypneic.  He appears to be in no acute distress.  He denies any complaints of shortness of breath, able to speak in full sentences without difficulty and satting 98% on room air.  Has some chest wall tenderness to palpation diffusely to the left chest.  Does not notice any worsening or alleviating factors, denies any pleuritic type chest pain and denies any increase in exercise recently into account for chest wall pain.  Pt does have a smoking history however quit 3 years ago.  No family history of CAD.  Denies any risk factors for PE however given his age I am unable to [facility] him out.  Does not sound less atypical for ACS however will work-up for ACS and include a D-dimer to rule out PE.  In regards to patient's headache he has no focal neuro deficits on exam and no meningeal signs.  I do not feel he needs CT scan of head. Does report he feels fatigued, no recent sick contacts or COVID-19  positive exposure however will swab at this time.  Patient did have his first Covid vaccine yesterday while at the pharmacy however began feeling ill 4 to 5 days ago.   CBC without leukocytosis.  Hemoglobin stable. BMP with glucose 131, no other electrolyte findings.  Troponin of 2.  EKG without ischemic changes. X-ray clear.  Dimer elevated at 0.79, will obtain CTA at this time. POC Covid  test negative.   CTA negative for PE.  Repeat troponin of 2.  Suspect musculoskeletal pain more than anything.  Patient advised to take ibuprofen and Tylenol as needed.  He may be having some chest wall pain related to Covid infection, patient advised to stay at home and self isolate until he receives his Covid results.  Is in agreement with plan.  His PCP has placed a referral to cardiology and I have advised him to follow-up with PCP until he hears about follow-up.  He is encouraged to return to the ED immediately for any worsening symptoms.  Patient is in agreement with plan and stable for discharge home.   This note was prepared using Dragon voice recognition software and may include unintentional dictation errors due to the inherent limitations of voice recognition software.  Aaron Blanchard was evaluated in Emergency Department on 07/20/2019 for the symptoms described in the history of present illness. He was evaluated in the context of the global COVID-19 pandemic, which necessitated consideration that the patient might be at risk for infection with the SARS-CoV-2 virus that causes COVID-19. Institutional protocols and algorithms that pertain to the evaluation of patients at risk for COVID-19 are in a state of rapid change based on information released by regulatory bodies including the CDC and federal and state organizations. These policies and algorithms were followed during the patient's care in the ED.   Final Clinical Impression(s) / ED Diagnoses Final diagnoses:  Nonspecific chest pain  Person under  investigation for COVID-19    Rx / DC Orders ED Discharge Orders    None       Discharge Instructions     Your labwork and imaging were reassuring today. Your pain may be related to muscle soreness. Please take Ibuprofen and Tylenol as needed for pain. Follow up with your PCP regarding referral to cardiology.   We have tested you for COVID 19 today - please stay home and self isolate until you receive your results. We will call you if you test positive. IF positive you will need to self isolate for 10 days and are cleared on: 07/31/2019. If negative you may resume normal daily activity.   Return to the ED IMMEDIATELY for any worsening symptoms including worsening pain, shortness of breath, coughing up blood, fevers > 100.4, passing out, or any other new/concerning symptoms       Eustaquio Maize, PA-C 07/20/19 2019    Lacretia Leigh, MD 07/21/19 [phone]

## 2019-07-20 NOTE — Discharge Instructions (Signed)
Your labwork and imaging were reassuring today. Your pain may be related to muscle soreness. Please take Ibuprofen and Tylenol as needed for pain. Follow up with your PCP regarding referral to cardiology.   We have tested you for COVID 19 today - please stay home and self isolate until you receive your results. We will call you if you test positive. IF positive you will need to self isolate for 10 days and are cleared on: 07/31/2019. If negative you may resume normal daily activity.   Return to the ED IMMEDIATELY for any worsening symptoms including worsening pain, shortness of breath, coughing up blood, fevers > 100.4, passing out, or any other new/concerning symptoms

## 2019-07-21 LAB — SARS CORONAVIRUS 2 (TAT 6-24 HRS): SARS Coronavirus 2: NEGATIVE

## 2019-08-08 ENCOUNTER — Ambulatory Visit: Payer: Self-pay | Admitting: Cardiology

## 2019-08-21 ENCOUNTER — Encounter: Payer: Self-pay | Admitting: Cardiology

## 2019-08-21 ENCOUNTER — Other Ambulatory Visit: Payer: Self-pay

## 2019-08-21 ENCOUNTER — Ambulatory Visit: Payer: Self-pay | Admitting: Cardiology

## 2019-08-21 VITALS — BP 105/68 | HR 70 | Resp 16 | Ht 71.0 in | Wt 215.6 lb

## 2019-08-21 DIAGNOSIS — Z13220 Encounter for screening for lipoid disorders: Secondary | ICD-10-CM

## 2019-08-21 DIAGNOSIS — R42 Dizziness and giddiness: Secondary | ICD-10-CM

## 2019-08-21 DIAGNOSIS — Z87891 Personal history of nicotine dependence: Secondary | ICD-10-CM

## 2019-08-21 DIAGNOSIS — R072 Precordial pain: Secondary | ICD-10-CM

## 2019-08-21 NOTE — Progress Notes (Signed)
Date:  08/21/2019   ID:  Aaron Blanchard, DOB 03/28/1965, MRN [id number]  PCP:  Aaron Bene, PA-C  Cardiologist:  Aaron Kras, DO, [facility] (established care 08/21/2019)  REASON FOR CONSULT: Chest Pain   REQUESTING PHYSICIAN:  Aaron Bene, PA-C [id number] [place],  [place] [id number]  Chief Complaint  Patient presents with  . Chest Pain  . New Patient (Initial Visit)    Referred by Aaron Lundborg, PA-C    HPI  Aaron Blanchard is a 54 y.o. male who presents to the office with a chief complaint of " chest pain." He is referred to the office at the request of Aaron Blanchard, Aaron Blanchard, *.  No known significant cardiac history.  Patient presents to the office for evaluation of chest pain and dizziness.  Patient states that the chest pain is located over the left anterior chest wall, localizable and reproducible, feels like an ache-like sensation, last 4 days in duration, intensity 9/10 during flareups, nonradiating, better with moving around and no worsening factors.  The pain is usually self-limited.  The pain is neither pleuritic nor positional.  Patient said that the pain was so severe back in May 2021 that he did go to [place] long.  He had a thorough cardiac work-up including EKG cardiac biomarkers and CT scan.  High sensitive troponins were negative x2.  Dizziness: Patient states that he has episodes of dizziness that occurred randomly.  Denies any near-syncope or syncopal events.  The dizziness is not brought on by changing head positions.   Denies prior history of coronary artery disease, myocardial infarction, congestive heart failure, deep venous thrombosis, pulmonary embolism, stroke, transient ischemic attack.   ALLERGIES: No Known Allergies  MEDICATION LIST PRIOR TO VISIT: Current Meds  Medication Sig  . aspirin EC 81 MG tablet Take 81 mg by mouth daily as needed for moderate pain.     PAST MEDICAL HISTORY: History reviewed. No pertinent past  medical history.  PAST SURGICAL HISTORY: Past Surgical History:  Procedure Laterality Date  . MINOR CARPAL TUNNEL Right 2020    FAMILY HISTORY: The patient family history includes Diabetes in his mother.  SOCIAL HISTORY:  The patient  reports that he quit smoking about 2 years ago. His smoking use included cigarettes. He has never used smokeless tobacco. He reports current alcohol use. He reports that he does not use drugs.  REVIEW OF SYSTEMS: Review of Systems  Constitutional: Negative for chills and fever.  HENT: Negative for hoarse voice and nosebleeds.   Eyes: Negative for discharge, double vision and pain.  Cardiovascular: Positive for chest pain. Negative for claudication, dyspnea on exertion, leg swelling, near-syncope, orthopnea, palpitations, paroxysmal nocturnal dyspnea and syncope.  Respiratory: Negative for hemoptysis and shortness of breath.   Musculoskeletal: Negative for muscle cramps and myalgias.  Gastrointestinal: Negative for abdominal pain, constipation, diarrhea, hematemesis, hematochezia, melena, nausea and vomiting.  Neurological: Positive for dizziness. Negative for light-headedness.    PHYSICAL EXAM: Vitals with BMI 08/21/2019 07/20/2019 07/20/2019  Height 5' 11"  - -  Weight 215 lbs 10 oz - -  BMI 84.66 - -  Systolic 599 357 017  Diastolic 68 68 78  Pulse 70 66 62   Orthostatic VS for the past 72 hrs (Last 3 readings):  Orthostatic BP Patient Position BP Location Cuff Size Orthostatic Pulse  08/21/19 1402 112/79 Sitting Left Arm Large 75  08/21/19 1401 103/75 Sitting Left Arm Large 66  08/21/19 1359 102/68 Supine Left Arm Large 64  CONSTITUTIONAL: Well-developed and well-nourished. No acute distress.  SKIN: Skin is warm and dry. No rash noted. No cyanosis. No pallor. No jaundice HEAD: Normocephalic and atraumatic.  EYES: No scleral icterus MOUTH/THROAT: Moist oral membranes.  NECK: No JVD present. No thyromegaly noted. No carotid bruits    LYMPHATIC: No visible cervical adenopathy.  CHEST Normal respiratory effort. No intercostal retractions  LUNGS: Clear to auscultation bilaterally. No stridor. No wheezes. No rales.  CARDIOVASCULAR: Regular rate and rhythm, positive S1-S2, no murmurs rubs or gallops appreciated. ABDOMINAL: No apparent ascites.  EXTREMITIES: No peripheral edema  HEMATOLOGIC: No significant bruising NEUROLOGIC: Oriented to person, place, and time. Nonfocal. Normal muscle tone.  PSYCHIATRIC: Normal mood and affect. Normal behavior. Cooperative  CARDIAC DATABASE: EKG: 08/21/2019: Normal sinus rhythm, 64 bpm, without underlying ischemia or injury pattern.    Echocardiogram: None  Stress Testing: None  Heart Catheterization: None  LABORATORY DATA: CBC Latest Ref Rng & Units 07/20/2019 10/21/2015  WBC 4.0 - 10.5 K/uL 6.3 6.0  Hemoglobin 13.0 - 17.0 g/dL 15.6 15.6  Hematocrit 39 - 52 % 45.6 45.7  Platelets 150 - 400 K/uL 240 230    CMP Latest Ref Rng & Units 07/20/2019  Glucose 70 - 99 mg/dL 131(H)  BUN 6 - 20 mg/dL 17  Creatinine 0.61 - 1.24 mg/dL 1.09  Sodium 135 - 145 mmol/L 136  Potassium 3.5 - 5.1 mmol/L 3.8  Chloride 98 - 111 mmol/L 104  CO2 22 - 32 mmol/L 23  Calcium 8.9 - 10.3 mg/dL 9.1    Lipid Panel  No results found for: CHOL, TRIG, HDL, CHOLHDL, VLDL, LDLCALC, LDLDIRECT, LABVLDL  No components found for: NTPROBNP No results for input(s): PROBNP in the last 8760 hours. No results for input(s): TSH in the last 8760 hours.  BMP Recent Labs    07/20/19 1435  NA 136  K 3.8  CL 104  CO2 23  GLUCOSE 131*  BUN 17  CREATININE 1.09  CALCIUM 9.1  GFRNONAA >60  GFRAA >60    HEMOGLOBIN A1C No results found for: HGBA1C, MPG  External Labs: Collected: 07/19/2019 at [facility], obtained from care everywhere Creatinine 1.1 mg/dL. eGFR: 76 mL/min per 1.73 m Hemoglobin 15.5 g/dL TSH: 1.93  IMPRESSION:    ICD-10-CM   1. Precordial pain  R07.2 EKG  12-Lead    Sedimentation rate    C-reactive protein    PCV ECHOCARDIOGRAM COMPLETE    PCV MYOCARDIAL PERFUSION WITH LEXISCAN    CBC  2. Former smoker  Z87.891   3. Dizziness  R42 PCV CAROTID DUPLEX (BILATERAL)  4. Screening for hyperlipidemia  Z13.220 Lipid Panel With LDL/HDL Ratio    LDL cholesterol, direct     RECOMMENDATIONS: Samnang Shugars is a 54 y.o. male who presents to the office for evaluation of chest pain and dizziness.  Precordial chest pain:  Patient symptoms of chest pain are atypical in nature.  However since they have been long standing he seeks medical attention for further evaluation.  Echocardiogram will be ordered to evaluate for structural heart disease and left ventricular systolic function.  Stress test to evaluate for reversible ischemia.  We will check ESR, CRP, and CBC for possible inflammatory causes.  Dizziness:  Orthostatic vital signs negative on today's examination.  Check carotid duplex to evaluate carotid atherosclerosis.  Screening for hyperlipidemia: We will check fasting lipid profile.  Former smoker: Educated on the importance of continued smoking cessation.  Explained to the patient that his symptoms may be  noncardiac in etiology and therefore should establish care with primary care physician as well.  A list of primary providers were given to the patient for reference.  FINAL MEDICATION LIST END OF ENCOUNTER: No orders of the defined types were placed in this encounter.    Current Outpatient Medications:  .  aspirin EC 81 MG tablet, Take 81 mg by mouth daily as needed for moderate pain., Disp: , Rfl:   Orders Placed This Encounter  Procedures  . Lipid Panel With LDL/HDL Ratio  . LDL cholesterol, direct  . Sedimentation rate  . C-reactive protein  . CBC  . PCV MYOCARDIAL PERFUSION WITH LEXISCAN  . EKG 12-Lead  . PCV ECHOCARDIOGRAM COMPLETE  . PCV CAROTID DUPLEX (BILATERAL)    There are no Patient Instructions on file for  this visit.   --Continue cardiac medications as reconciled in final medication list. --Return in about 4 weeks (around 09/18/2019) for re-evaluation of symptoms., review test results.. Or sooner if needed. --Continue follow-up with your primary care physician regarding the management of your other chronic comorbid conditions.  Patient's questions and concerns were addressed to his satisfaction. He voices understanding of the instructions provided during this encounter.   This note was created using a voice recognition software as a result there may be grammatical errors inadvertently enclosed that do not reflect the nature of this encounter. Every attempt is made to correct such errors.  Aaron Blanchard, [place], [facility]  Pager: [phone] Office: [phone]

## 2019-08-28 ENCOUNTER — Other Ambulatory Visit: Payer: Self-pay

## 2019-08-28 ENCOUNTER — Ambulatory Visit: Payer: Self-pay

## 2019-08-28 DIAGNOSIS — R072 Precordial pain: Secondary | ICD-10-CM

## 2019-08-28 DIAGNOSIS — R42 Dizziness and giddiness: Secondary | ICD-10-CM

## 2019-08-29 LAB — LIPID PANEL WITH LDL/HDL RATIO
Cholesterol, Total: 220 mg/dL — ABNORMAL HIGH (ref 100–199)
HDL: 40 mg/dL (ref >39)
LDL Chol Calc (NIH): 93 mg/dL (ref 0–99)
LDL/HDL Ratio: 2.3 ratio (ref 0.0–3.6)
Triglycerides: 523 mg/dL — ABNORMAL HIGH (ref 0–149)
VLDL Cholesterol Cal: 87 mg/dL — ABNORMAL HIGH (ref 5–40)

## 2019-08-29 LAB — CBC
Hematocrit: 45.5 % (ref 37.5–51.0)
Hemoglobin: 15.5 g/dL (ref 13.0–17.7)
MCH: 30.1 pg (ref 26.6–33.0)
MCHC: 34.1 g/dL (ref 31.5–35.7)
MCV: 88 fL (ref 79–97)
Platelets: 256 10*3/uL (ref 150–450)
RBC: 5.15 x10E6/uL (ref 4.14–5.80)
RDW: 13.5 % (ref 11.6–15.4)
WBC: 6.3 10*3/uL (ref 3.4–10.8)

## 2019-08-29 LAB — C-REACTIVE PROTEIN: CRP: 2 mg/L (ref 0–10)

## 2019-08-29 LAB — LDL CHOLESTEROL, DIRECT: LDL Direct: 76 mg/dL (ref 0–99)

## 2019-08-29 LAB — SEDIMENTATION RATE: Sed Rate: 8 mm/hr (ref 0–30)

## 2019-08-30 ENCOUNTER — Telehealth: Payer: Self-pay

## 2019-08-30 NOTE — Progress Notes (Signed)
Called patient, NA, LMAM to call back about his lab results.

## 2019-08-30 NOTE — Telephone Encounter (Signed)
Gave patient results he verbalized understanding

## 2019-08-30 NOTE — Telephone Encounter (Signed)
-----   Message from [place], [place] sent at 08/29/2019  6:57 PM EDT ----- No significant blockages in bilateral internal carotid arteries.

## 2019-09-04 ENCOUNTER — Ambulatory Visit: Payer: Self-pay

## 2019-09-04 ENCOUNTER — Other Ambulatory Visit: Payer: Self-pay

## 2019-09-04 DIAGNOSIS — R072 Precordial pain: Secondary | ICD-10-CM

## 2019-09-10 NOTE — Progress Notes (Signed)
Called and spoke with patient regarding his stress test results.

## 2019-09-10 NOTE — Progress Notes (Signed)
Called and spoke with patient regarding his echocardiogram results.  ?

## 2019-09-18 ENCOUNTER — Ambulatory Visit: Payer: Self-pay | Admitting: Cardiology

## 2019-09-26 ENCOUNTER — Telehealth: Payer: Self-pay

## 2019-09-26 NOTE — Telephone Encounter (Signed)
-----  Message from [place], [place] sent at 08/29/2019  6:48 PM EDT ----- Please inform the patient that his CBC notes no signs of infection.  Inflammatory markers such as ESR and CRP are within normal limits. Please inform the patient that his total cholesterol and triglycerides are significantly elevated.  Please inform the patient to decrease the consumption of oily/greasy foods, alcohol/beer, and carbohydrates as much as possible.  Normal triglyceride level should be less than 150 and his is 523 and therefore would recommend medications.  If patient agreeable please let me know and I can send a prescription to his preferred pharmacy.

## 2019-09-26 NOTE — Telephone Encounter (Signed)
Called pt to no answer, left a vm to  inform him about his lab results. And to give [place] a call back

## 2019-09-27 NOTE — Progress Notes (Signed)
Called pt no answer, left a vm

## 2019-09-30 ENCOUNTER — Ambulatory Visit: Payer: Self-pay | Admitting: Cardiology

## 2019-10-11 ENCOUNTER — Ambulatory Visit: Payer: Self-pay | Admitting: Cardiology

## 2019-10-11 ENCOUNTER — Other Ambulatory Visit: Payer: Self-pay

## 2019-10-11 ENCOUNTER — Encounter: Payer: Self-pay | Admitting: Cardiology

## 2019-10-11 VITALS — BP 118/75 | HR 74 | Resp 16 | Ht 71.0 in | Wt 219.0 lb

## 2019-10-11 DIAGNOSIS — Z712 Person consulting for explanation of examination or test findings: Secondary | ICD-10-CM

## 2019-10-11 DIAGNOSIS — Z87891 Personal history of nicotine dependence: Secondary | ICD-10-CM

## 2019-10-11 DIAGNOSIS — E781 Pure hyperglyceridemia: Secondary | ICD-10-CM

## 2019-10-11 NOTE — Progress Notes (Signed)
Date:  10/11/2019   ID:  Aaron Blanchard, DOB [birth year], MRN [id number]  PCP:  Patient, No Pcp Per  Cardiologist:  Rex Kras, DO, [facility] (established care 08/21/2019)  Date: 10/11/19 Last Office Visit: 08/21/2019  Chief Complaint  Patient presents with  . Precordial pain  . Follow-up    4 week    HPI  Aaron Blanchard is a 54 y.o. male who presents to the office with a chief complaint of " reevaluation of chest pain and review test results ."  His past medical history and cardiovascular risk factors include hypertriglyceridemia, former smoker.  Patient initially came to the office back in June 2021 for evaluation of chest pain and dizziness.  At which time his chest discomfort appear to be atypical in nature.  But he was very concerned that he may have a cardiac event in the near future and wanted a cardiovascular evaluation more sooner than later.  Patient underwent an echocardiogram and exercise nuclear stress test.  And also had blood work done for further risk ratification and also check ESR and CRP to see there is any inflammatory causes of his discomfort.  Patient's echocardiogram notes preserved LVEF with normal diastolic function without any significant valvular heart disease.  Exercise nuclear stress test notes excellent functional capacity and overall low risk study.  We will independently reviewed the most recent blood work with the patient.  Inflammatory markers are within normal limits.  Patient states that since last office visit he does not have any chest discomfort.  And his dizziness has also been resolved.  I did also review the results of the carotid duplex with him at today's office visit.  Patient is also established care with a new PCP and has a appointment coming up.  Denies prior history of coronary artery disease, myocardial infarction, congestive heart failure, deep venous thrombosis, pulmonary embolism, stroke, transient ischemic attack.   ALLERGIES: No Known  Allergies  MEDICATION LIST PRIOR TO VISIT: Current Meds  Medication Sig  . aspirin EC 81 MG tablet Take 81 mg by mouth daily as needed for moderate pain.     PAST MEDICAL HISTORY: Past Medical History:  Diagnosis Date  . Hypertriglyceridemia     PAST SURGICAL HISTORY: Past Surgical History:  Procedure Laterality Date  . MINOR CARPAL TUNNEL Right 2020    FAMILY HISTORY: The patient family history includes Diabetes in his mother.  SOCIAL HISTORY:  The patient  reports that he quit smoking about 2 years ago. His smoking use included cigarettes. He has never used smokeless tobacco. He reports current alcohol use. He reports that he does not use drugs.  REVIEW OF SYSTEMS: Review of Systems  Constitutional: Negative for chills and fever.  HENT: Negative for hoarse voice and nosebleeds.   Eyes: Negative for discharge, double vision and pain.  Cardiovascular: Negative for chest pain, claudication, dyspnea on exertion, leg swelling, near-syncope, orthopnea, palpitations, paroxysmal nocturnal dyspnea and syncope.  Respiratory: Negative for hemoptysis and shortness of breath.   Musculoskeletal: Negative for muscle cramps and myalgias.  Gastrointestinal: Negative for abdominal pain, constipation, diarrhea, hematemesis, hematochezia, melena, nausea and vomiting.  Neurological: Negative for dizziness and light-headedness.    PHYSICAL EXAM: Vitals with BMI 10/11/2019 08/21/2019 07/20/2019  Height 5' 11" 5' 11" -  Weight 219 lbs 215 lbs 10 oz -  BMI 89.38 10.17 -  Systolic 510 258 527  Diastolic 75 68 68  Pulse 74 70 66   CONSTITUTIONAL: Well-developed and well-nourished. No acute distress.  SKIN:  Skin is warm and dry. No rash noted. No cyanosis. No pallor. No jaundice HEAD: Normocephalic and atraumatic.  EYES: No scleral icterus MOUTH/THROAT: Moist oral membranes.  NECK: No JVD present. No thyromegaly noted. No carotid bruits  LYMPHATIC: No visible cervical adenopathy.  CHEST  Normal respiratory effort. No intercostal retractions  LUNGS: Clear to auscultation bilaterally. No stridor. No wheezes. No rales.  CARDIOVASCULAR: Regular rate and rhythm, positive S1-S2, no murmurs rubs or gallops appreciated. ABDOMINAL: No apparent ascites.  EXTREMITIES: No peripheral edema  HEMATOLOGIC: No significant bruising NEUROLOGIC: Oriented to person, place, and time. Nonfocal. Normal muscle tone.  PSYCHIATRIC: Normal mood and affect. Normal behavior. Cooperative  CARDIAC DATABASE: EKG: 08/21/2019: Normal sinus rhythm, 64 bpm, without underlying ischemia or injury pattern.    Echocardiogram: 08/28/2019:  Normal LV systolic function with visual EF 55-60%. Left ventricle cavity is normal in size. Mild left ventricular hypertrophy. Normal global wall motion. Normal diastolic filling pattern, normal LAP. Calculated EF 55%.  Mild (Grade I) mitral regurgitation.  Mild tricuspid regurgitation.  No prior study for comparison.    Stress Testing: Exercise Sestamibi stress test 09/04/2019:  Exercise nuclear stress test was performed using Bruce protocol. Patient reached 13.4 METS, and 90% of age predicted maximum heart rate. Exercise capacity was excellent. Chest pain not reported. Heart rate and hemodynamic response were normal. Stress EKG revealed no ischemic changes.  Normal myocardial perfusion. Stress LVEF 63%.  Low risk study,  Heart Catheterization: None  Carotid artery duplex 08/28/2019:  No hemodynamically significant arterial disease in the internal carotid artery bilaterally. No significant plaque noted in bilateral carotid arteries.  Antegrade right vertebral artery flow. Antegrade left vertebral artery flow.   LABORATORY DATA: CBC Latest Ref Rng & Units 08/28/2019 07/20/2019 10/21/2015  WBC 3.4 - 10.8 x10E3/uL 6.3 6.3 6.0  Hemoglobin 13.0 - 17.7 g/dL 15.5 15.6 15.6  Hematocrit 37.5 - 51.0 % 45.5 45.6 45.7  Platelets 150 - 450 x10E3/uL 256 240 230    CMP Latest Ref Rng  & Units 07/20/2019  Glucose 70 - 99 mg/dL 131(H)  BUN 6 - 20 mg/dL 17  Creatinine 0.61 - 1.24 mg/dL 1.09  Sodium 135 - 145 mmol/L 136  Potassium 3.5 - 5.1 mmol/L 3.8  Chloride 98 - 111 mmol/L 104  CO2 22 - 32 mmol/L 23  Calcium 8.9 - 10.3 mg/dL 9.1    Lipid Panel     Component Value Date/Time   CHOL 220 (H) 08/28/2019 1409   TRIG 523 (H) 08/28/2019 1409   HDL 40 08/28/2019 1409   LDLCALC 93 08/28/2019 1409   LDLDIRECT 76 08/28/2019 1409   LABVLDL 87 (H) 08/28/2019 1409    No components found for: NTPROBNP No results for input(s): PROBNP in the last 8760 hours. No results for input(s): TSH in the last 8760 hours.  BMP Recent Labs    07/20/19 1435  NA 136  K 3.8  CL 104  CO2 23  GLUCOSE 131*  BUN 17  CREATININE 1.09  CALCIUM 9.1  GFRNONAA >60  GFRAA >60    HEMOGLOBIN A1C No results found for: HGBA1C, MPG  External Labs: Collected: 07/19/2019 at [facility], obtained from care everywhere Creatinine 1.1 mg/dL. eGFR: 76 mL/min per 1.73 m Hemoglobin 15.5 g/dL TSH: 1.93  IMPRESSION:    ICD-10-CM   1. Hypertriglyceridemia  E78.1 LDL cholesterol, direct    Lipoprotein A (LPA)    Lipid Panel With LDL/HDL Ratio    Apo A1 + B + Ratio  CT CARDIAC SCORING    Apo A1 + B + Ratio    Lipid Panel With LDL/HDL Ratio    Lipoprotein A (LPA)    LDL cholesterol, direct  2. Encounter to discuss test results  Z71.2   3. Former smoker  Z87.891      RECOMMENDATIONS: Aaron Blanchard is a 54 y.o. male with past medical history and cardiovascular risk factors include hypertriglyceridemia, former smoker.  Patient does not have any chest pain like he did prior to his last office visit.  Reviewed the results of the echocardiogram, stress test, and carotid duplex with him at today's office visit.  I also independently reviewed his most recent blood work that was ordered at last visit with the patient.  Hypertriglyceridemia:  Did review with the patient  that his triglyceride levels are not well controlled.   And his estimated 10-year risk of ASCVD is approximately 5.6%.  Therefore moderate intensity statin is recommended.  However, patient wants to focus on lifestyle modifications prior to pharmacological therapy.  Patient is educated on importance of reducing the amount of fried foods that he eats, frequent snacking, and alcohol consumption.  I will repeat fasting lipid profile in [redacted] weeks along with APO B levels, and direct LDL, and LP(a).  Coronary artery calcification score for further risk ratification as well.  Former smoker: Educated on the importance of continued smoking cessation.  FINAL MEDICATION LIST END OF ENCOUNTER: No orders of the defined types were placed in this encounter.    Current Outpatient Medications:  .  aspirin EC 81 MG tablet, Take 81 mg by mouth daily as needed for moderate pain., Disp: , Rfl:   Orders Placed This Encounter  Procedures  . CT CARDIAC SCORING  . LDL cholesterol, direct  . Lipoprotein A (LPA)  . Lipid Panel With LDL/HDL Ratio  . Apo A1 + B + Ratio    There are no Patient Instructions on file for this visit.   --Continue cardiac medications as reconciled in final medication list. --Return in about 8 weeks (around 12/06/2019) for TAG and review CAC score study. . Or sooner if needed. --Continue follow-up with your primary care physician regarding the management of your other chronic comorbid conditions.  Patient's questions and concerns were addressed to his satisfaction. He voices understanding of the instructions provided during this encounter.   This note was created using a voice recognition software as a result there may be grammatical errors inadvertently enclosed that do not reflect the nature of this encounter. Every attempt is made to correct such errors.  Rex Kras, [place], [facility]  Pager: [phone] Office: [phone]

## 2019-12-06 ENCOUNTER — Other Ambulatory Visit: Payer: Self-pay

## 2019-12-06 ENCOUNTER — Ambulatory Visit: Payer: Self-pay | Admitting: Cardiology

## 2019-12-06 ENCOUNTER — Encounter: Payer: Self-pay | Admitting: Cardiology

## 2019-12-06 VITALS — BP 116/81 | HR 82 | Ht 71.0 in | Wt 214.0 lb

## 2019-12-06 DIAGNOSIS — Z712 Person consulting for explanation of examination or test findings: Secondary | ICD-10-CM

## 2019-12-06 DIAGNOSIS — E781 Pure hyperglyceridemia: Secondary | ICD-10-CM

## 2019-12-06 DIAGNOSIS — Z87891 Personal history of nicotine dependence: Secondary | ICD-10-CM

## 2019-12-06 NOTE — Progress Notes (Signed)
Date:  12/06/2019   ID:  [name], DOB Jun 13, 1965, MRN [id number]  PCP:  Patient, No Pcp Per  Cardiologist:  Rex Kras, DO, FACC (established care 08/21/2019)  Date: 12/06/19 Last Office Visit: 10/11/2019  Chief Complaint  Patient presents with  . Follow-up  . Results    HPI  Jalik Gellatly is a 54 y.o. male who presents to the office with a chief complaint of " review test results."  His past medical history and cardiovascular risk factors include hypertriglyceridemia, former smoker.  Patient initially came to the office back in June 2021 for evaluation of chest pain and dizziness.  At which time his chest discomfort appear to be atypical in nature.  But he was very concerned that he may have a cardiac event in the near future and requested a cardiovascular evaluation.  Patient underwent an echocardiogram and exercise nuclear stress test.  And also had blood work done for further risk stratification.  Patient's echocardiogram notes preserved LVEF with normal diastolic function without any significant valvular heart disease.  Exercise nuclear stress test notes excellent functional capacity and overall low risk study.  Inflammatory markers are within normal limits.  At the last office visit lab work noted hypertriglyceridemia and patient wanted to try lifestyle modifications and to reevaluate lipid profile prior to considering pharmacological therapy.  Patient states that he is working on his dietary changes but forgot to get lipid profile done prior to today's visit.  He did have a coronary artery calcification score since last office visit.  Per report his total coronary calcification score is 0.  The significance of this finding was discussed in great detail during today's visit.  Since last office visit he does not have any chest discomfort.    Patient is also established care with a new PCP and has a appointment coming up.  Denies prior history of coronary artery disease,  myocardial infarction, congestive heart failure, deep venous thrombosis, pulmonary embolism, stroke, transient ischemic attack.   ALLERGIES: No Known Allergies  MEDICATION LIST PRIOR TO VISIT: Current Meds  Medication Sig  . aspirin EC 81 MG tablet Take 81 mg by mouth daily as needed for moderate pain.     PAST MEDICAL HISTORY: Past Medical History:  Diagnosis Date  . Hypertriglyceridemia     PAST SURGICAL HISTORY: Past Surgical History:  Procedure Laterality Date  . MINOR CARPAL TUNNEL Right 2020    FAMILY HISTORY: The patient family history includes Diabetes in his mother.  SOCIAL HISTORY:  The patient  reports that he quit smoking about 2 years ago. His smoking use included cigarettes. He has never used smokeless tobacco. He reports current alcohol use. He reports that he does not use drugs.  REVIEW OF SYSTEMS: Review of Systems  Constitutional: Negative for chills and fever.  HENT: Negative for hoarse voice and nosebleeds.   Eyes: Negative for discharge, double vision and pain.  Cardiovascular: Negative for chest pain, claudication, dyspnea on exertion, leg swelling, near-syncope, orthopnea, palpitations, paroxysmal nocturnal dyspnea and syncope.  Respiratory: Negative for hemoptysis and shortness of breath.   Musculoskeletal: Negative for muscle cramps and myalgias.  Gastrointestinal: Negative for abdominal pain, constipation, diarrhea, hematemesis, hematochezia, melena, nausea and vomiting.  Neurological: Negative for dizziness and light-headedness.   PHYSICAL EXAM: Vitals with BMI 12/06/2019 10/11/2019 08/21/2019  Height _0  _1  _2   Weight 214 lbs 219 lbs 215 lbs 10 oz  BMI 29.86 25.85 27.78  Systolic 242 353 614  Diastolic 81 75 68  Pulse 82 74 70   CONSTITUTIONAL: Well-developed and well-nourished. No acute distress.  SKIN: Skin is warm and dry. No rash noted. No cyanosis. No pallor. No jaundice HEAD: Normocephalic and atraumatic.  EYES: No scleral  icterus MOUTH/THROAT: Moist oral membranes.  NECK: No JVD present. No thyromegaly noted. No carotid bruits  LYMPHATIC: No visible cervical adenopathy.  CHEST Normal respiratory effort. No intercostal retractions  LUNGS: Clear to auscultation bilaterally. No stridor. No wheezes. No rales.  CARDIOVASCULAR: Regular rate and rhythm, positive S1-S2, no murmurs rubs or gallops appreciated. ABDOMINAL: No apparent ascites.  EXTREMITIES: No peripheral edema  HEMATOLOGIC: No significant bruising NEUROLOGIC: Oriented to person, place, and time. Nonfocal. Normal muscle tone.  PSYCHIATRIC: Normal mood and affect. Normal behavior. Cooperative  CARDIAC DATABASE: EKG: 08/21/2019: Normal sinus rhythm, 64 bpm, without underlying ischemia or injury pattern.    Echocardiogram: 08/28/2019:  Normal LV systolic function with visual EF 55-60%. Left ventricle cavity is normal in size. Mild left ventricular hypertrophy. Normal global wall motion. Normal diastolic filling pattern, normal LAP. Calculated EF 55%.  Mild (Grade I) mitral regurgitation.  Mild tricuspid regurgitation.  No prior study for comparison.    Stress Testing: Exercise Sestamibi stress test 09/04/2019:  Exercise nuclear stress test was performed using Bruce protocol. Patient reached 13.4 METS, and 90% of age predicted maximum heart rate. Exercise capacity was excellent. Chest pain not reported. Heart rate and hemodynamic response were normal. Stress EKG revealed no ischemic changes.  Normal myocardial perfusion. Stress LVEF 63%.  Low risk study,  Heart Catheterization: None  Coronary calcification scoring at [facility] health 12/05/2019: Total coronary calcification score 0.  Carotid artery duplex 08/28/2019:  No hemodynamically significant arterial disease in the internal carotid artery bilaterally. No significant plaque noted in bilateral carotid arteries.  Antegrade right vertebral artery flow. Antegrade left vertebral artery flow.    LABORATORY DATA: CBC Latest Ref Rng & Units 08/28/2019 07/20/2019 10/21/2015  WBC 3.4 - 10.8 x10E3/uL 6.3 6.3 6.0  Hemoglobin 13.0 - 17.7 g/dL 15.5 15.6 15.6  Hematocrit 37.5 - 51.0 % 45.5 45.6 45.7  Platelets 150 - 450 x10E3/uL 256 240 230    CMP Latest Ref Rng & Units 07/20/2019  Glucose 70 - 99 mg/dL 131(H)  BUN 6 - 20 mg/dL 17  Creatinine 0.61 - 1.24 mg/dL 1.09  Sodium 135 - 145 mmol/L 136  Potassium 3.5 - 5.1 mmol/L 3.8  Chloride 98 - 111 mmol/L 104  CO2 22 - 32 mmol/L 23  Calcium 8.9 - 10.3 mg/dL 9.1    Lipid Panel     Component Value Date/Time   CHOL 220 (H) 08/28/2019 1409   TRIG 523 (H) 08/28/2019 1409   HDL 40 08/28/2019 1409   LDLCALC 93 08/28/2019 1409   LDLDIRECT 76 08/28/2019 1409   LABVLDL 87 (H) 08/28/2019 1409    No components found for: NTPROBNP No results for input(s): PROBNP in the last 8760 hours. No results for input(s): TSH in the last 8760 hours.  BMP Recent Labs    07/20/19 1435  NA 136  K 3.8  CL 104  CO2 23  GLUCOSE 131*  BUN 17  CREATININE 1.09  CALCIUM 9.1  GFRNONAA >60  GFRAA >60    HEMOGLOBIN A1C No results found for: HGBA1C, MPG  External Labs: Collected: 07/19/2019 at [facility], obtained from care everywhere Creatinine 1.1 mg/dL. eGFR: 76 mL/min per 1.73 m Hemoglobin 15.5 g/dL TSH: 1.93  IMPRESSION:    ICD-10-CM   1. Hypertriglyceridemia  E78.1 CMP14+EGFR  2. Encounter to discuss test results  Z71.2   3. Former smoker  Z87.891      RECOMMENDATIONS: Olanrewaju Osborn is a 54 y.o. male with past medical history and cardiovascular risk factors include hypertriglyceridemia, former smoker.  Hypertriglyceridemia:  Did review with the patient that his triglyceride levels are not well controlled.   Since last office visit patient states that he has reduced consumption fried foods that he eats, does not refill snack, and reduced alcohol consumption.   Asked him to perform another fasting lipid  profile to reevaluate the impact of lifestyle modifications.  Decision to proceed with pharmacological therapy is still pending.    Patient has undergone extensive cardiovascular work-up given his symptoms of chest discomfort in the past.  Patient's total coronary calcification score is 0, LVEF is preserved and exercise stress test was low risk.  Educated on importance of primary prevention for coronary artery disease.  Patient takes aspirin for her musculoskeletal pain.  He is made aware that aspirin is currently not indicated from a cardiovascular standpoint at his CAD work-up was unremarkable.  Patient is encouraged to establish care with PCP for longitudinal care.  Former smoker: Educated on the importance of continued smoking cessation.  I will see the patient in 1 year follow-up for primary prevention.  FINAL MEDICATION LIST END OF ENCOUNTER: No orders of the defined types were placed in this encounter.    Current Outpatient Medications:  .  aspirin EC 81 MG tablet, Take 81 mg by mouth daily as needed for moderate pain., Disp: , Rfl:   Orders Placed This Encounter  Procedures  . CMP14+EGFR   There are no Patient Instructions on file for this visit.   --Continue cardiac medications as reconciled in final medication list. --Return in about 1 year (around 12/05/2020) for Follow up primary prevention or as needed. . Or sooner if needed. --Continue follow-up with your primary care physician regarding the management of your other chronic comorbid conditions.  Patient's questions and concerns were addressed to his satisfaction. He voices understanding of the instructions provided during this encounter.   This note was created using a voice recognition software as a result there may be grammatical errors inadvertently enclosed that do not reflect the nature of this encounter. Every attempt is made to correct such errors.  Rex Kras, [place], [facility]  Pager: [phone] Office:  [phone]

## 2019-12-18 ENCOUNTER — Other Ambulatory Visit: Payer: Self-pay | Admitting: Cardiology

## 2019-12-18 DIAGNOSIS — E781 Pure hyperglyceridemia: Secondary | ICD-10-CM

## 2019-12-18 LAB — CMP14+EGFR
ALT: 27 IU/L (ref 0–44)
AST: 22 IU/L (ref 0–40)
Albumin/Globulin Ratio: 1.4 (ref 1.2–2.2)
Albumin: 4.2 g/dL (ref 3.8–4.9)
Alkaline Phosphatase: 79 IU/L (ref 44–121)
BUN/Creatinine Ratio: 9 (ref 9–20)
BUN: 9 mg/dL (ref 6–24)
Bilirubin Total: 0.4 mg/dL (ref 0.0–1.2)
CO2: 23 mmol/L (ref 20–29)
Calcium: 9.5 mg/dL (ref 8.7–10.2)
Chloride: 103 mmol/L (ref 96–106)
Creatinine, Ser: 0.99 mg/dL (ref 0.76–1.27)
GFR calc Af Amer: 100 mL/min/{1.73_m2} (ref >59)
GFR calc non Af Amer: 87 mL/min/{1.73_m2} (ref >59)
Globulin, Total: 2.9 g/dL (ref 1.5–4.5)
Glucose: 114 mg/dL — ABNORMAL HIGH (ref 65–99)
Potassium: 4.4 mmol/L (ref 3.5–5.2)
Sodium: 140 mmol/L (ref 134–144)
Total Protein: 7.1 g/dL (ref 6.0–8.5)

## 2019-12-18 LAB — LIPID PANEL WITH LDL/HDL RATIO
Cholesterol, Total: 198 mg/dL (ref 100–199)
HDL: 38 mg/dL — ABNORMAL LOW (ref >39)
LDL Chol Calc (NIH): 67 mg/dL (ref 0–99)
LDL/HDL Ratio: 1.8 ratio (ref 0.0–3.6)
Triglycerides: 610 mg/dL (ref 0–149)
VLDL Cholesterol Cal: 93 mg/dL — ABNORMAL HIGH (ref 5–40)

## 2019-12-18 LAB — LDL CHOLESTEROL, DIRECT: LDL Direct: 61 mg/dL (ref 0–99)

## 2019-12-18 LAB — APO A1 + B + RATIO
Apolipo. B/A-1 Ratio: 0.6 ratio (ref 0.0–0.7)
Apolipoprotein A-1: 163 mg/dL (ref 101–178)
Apolipoprotein B: 95 mg/dL — ABNORMAL HIGH (ref <90)

## 2019-12-18 LAB — LIPOPROTEIN A (LPA): Lipoprotein (a): <8.4 nmol/L (ref <75.0)

## 2019-12-18 MED ORDER — FENOFIBRATE 145 MG PO TABS: 145.0000 mg | ORAL_TABLET | Freq: Every day | ORAL | 0 refills | Status: DC

## 2020-02-07 ENCOUNTER — Ambulatory Visit: Payer: Self-pay | Admitting: Cardiology

## 2020-12-07 ENCOUNTER — Ambulatory Visit: Payer: Self-pay | Admitting: Cardiology

## 2020-12-31 ENCOUNTER — Other Ambulatory Visit: Payer: Self-pay

## 2020-12-31 ENCOUNTER — Encounter: Payer: Self-pay | Admitting: Cardiology

## 2020-12-31 ENCOUNTER — Ambulatory Visit: Payer: Self-pay | Admitting: Cardiology

## 2020-12-31 VITALS — BP 100/70 | HR 57 | Temp 98.0°F | Resp 16 | Ht 71.0 in | Wt 206.2 lb

## 2020-12-31 DIAGNOSIS — E781 Pure hyperglyceridemia: Secondary | ICD-10-CM

## 2020-12-31 NOTE — Progress Notes (Signed)
Date:  12/31/2020   ID:  Aaron Blanchard, DOB [birth year], MRN [id number]  PCP:  Aaron Major, MD  Cardiologist:  Aaron Kras, DO, [facility] (established care 08/21/2019)  Date: 12/31/20 Last Office Visit: 12/06/2019  Chief Complaint  Patient presents with   Hyperlipidemia   Follow-up    1 year    HPI  Aaron Blanchard is a 55 y.o. male who presents to the office with a chief complaint of " 1 year follow-up. "  His past medical history and cardiovascular risk factors include hypertriglyceridemia, former smoker.  Initially seen back in June 2021 for evaluation of chest pain and dizziness.  He underwent an ischemic evaluation as outlined below and the shared decision was to continue follow-up on an annual basis given his risk factors.  Since last office visit patient states that he has establish care with Dr. Terrill Blanchard and has been placed on PPI for his underlying heartburn and cholesterol medication.  He does not recall the names or the dosage of such medications but per EMR he is currently on fenofibrate and omeprazole..  Patient states that he is also had recent blood work but does not have a copy of it for review.  Patient states that his lipid profile is improving but not optimized.  Since last office visit he denies any chest pain or shortness of breath at rest or with effort related activities.  He continues to work in [employer] without any decrease in physical endurance.  ALLERGIES: No Known Allergies  MEDICATION LIST PRIOR TO VISIT: Current Meds  Medication Sig   aspirin EC 81 MG tablet Take 81 mg by mouth daily as needed for moderate pain.   fenofibrate (TRICOR) 145 MG tablet Take 1 tablet (145 mg total) by mouth daily.   omeprazole (PRILOSEC) 40 MG capsule Take 1 capsule by mouth daily.     PAST MEDICAL HISTORY: Past Medical History:  Diagnosis Date   Hypertriglyceridemia     PAST SURGICAL HISTORY: Past Surgical History:  Procedure Laterality Date    MINOR CARPAL TUNNEL Right 2020    FAMILY HISTORY: The patient family history includes Diabetes in his mother.  SOCIAL HISTORY:  The patient  reports that he quit smoking about 3 years ago. His smoking use included cigarettes. He has a 12.50 pack-year smoking history. He has never used smokeless tobacco. He reports current alcohol use. He reports that he does not use drugs.  REVIEW OF SYSTEMS: Review of Systems  Constitutional: Negative for chills and fever.  HENT:  Negative for hoarse voice and nosebleeds.   Eyes:  Negative for discharge, double vision and pain.  Cardiovascular:  Negative for chest pain, claudication, dyspnea on exertion, leg swelling, near-syncope, orthopnea, palpitations, paroxysmal nocturnal dyspnea and syncope.  Respiratory:  Negative for hemoptysis and shortness of breath.   Musculoskeletal:  Negative for muscle cramps and myalgias.  Gastrointestinal:  Negative for abdominal pain, constipation, diarrhea, hematemesis, hematochezia, melena, nausea and vomiting.  Neurological:  Negative for dizziness and light-headedness.   PHYSICAL EXAM: Vitals with BMI 12/31/2020 12/06/2019 10/11/2019  Height 5' 11"  5' 11"  5' 11"   Weight 206 lbs 3 oz 214 lbs 219 lbs  BMI 28.77 37.62 83.15  Systolic 176 160 737  Diastolic 70 81 75  Pulse 57 82 74   CONSTITUTIONAL: Well-developed and well-nourished. No acute distress.  SKIN: Skin is warm and dry. No rash noted. No cyanosis. No pallor. No jaundice HEAD: Normocephalic and atraumatic.  EYES: No scleral icterus MOUTH/THROAT: Moist oral membranes.  NECK: No JVD present. No thyromegaly noted. No carotid bruits  LYMPHATIC: No visible cervical adenopathy.  CHEST Normal respiratory effort. No intercostal retractions  LUNGS: Clear to auscultation bilaterally. No stridor. No wheezes. No rales.  CARDIOVASCULAR: Regular rate and rhythm, positive S1-S2, no murmurs rubs or gallops appreciated. ABDOMINAL: Soft, nontender, nondistended,  positive bowel sounds in all 4 quadrants, no apparent ascites.  EXTREMITIES: No peripheral edema, warm to touch, 2+ DP and PT pulses. HEMATOLOGIC: No significant bruising NEUROLOGIC: Oriented to person, place, and time. Nonfocal. Normal muscle tone.  PSYCHIATRIC: Normal mood and affect. Normal behavior. Cooperative  CARDIAC DATABASE: EKG: 12/31/2020: Sinus bradycardia, 56 bpm, normal axis, without underlying ischemia or injury pattern.  Echocardiogram: 08/28/2019:  Normal LV systolic function with visual EF 55-60%. Left ventricle cavity is normal in size. Mild left ventricular hypertrophy. Normal global wall motion. Normal diastolic filling pattern, normal LAP. Calculated EF 55%.  Mild (Grade I) mitral regurgitation.  Mild tricuspid regurgitation.  No prior study for comparison.    Stress Testing: Exercise Sestamibi stress test 09/04/2019:  Exercise nuclear stress test was performed using Bruce protocol. Patient reached 13.4 METS, and 90% of age predicted maximum heart rate. Exercise capacity was excellent. Chest pain not reported. Heart rate and hemodynamic response were normal. Stress EKG revealed no ischemic changes.  Normal myocardial perfusion. Stress LVEF 63%.  Low risk study,  Heart Catheterization: None  Coronary calcification scoring at [facility] health 12/05/2019: Total coronary calcification score 0.  Carotid artery duplex  08/28/2019:  No hemodynamically significant arterial disease in the internal carotid artery bilaterally. No significant plaque noted in bilateral carotid arteries.  Antegrade right vertebral artery flow. Antegrade left vertebral artery flow.   LABORATORY DATA: CBC Latest Ref Rng & Units 08/28/2019 07/20/2019 10/21/2015  WBC 3.4 - 10.8 x10E3/uL 6.3 6.3 6.0  Hemoglobin 13.0 - 17.7 g/dL 15.5 15.6 15.6  Hematocrit 37.5 - 51.0 % 45.5 45.6 45.7  Platelets 150 - 450 x10E3/uL 256 240 230    CMP Latest Ref Rng & Units 12/17/2019 07/20/2019  Glucose 65 - 99 mg/dL  114(H) 131(H)  BUN 6 - 24 mg/dL 9 17  Creatinine 0.76 - 1.27 mg/dL 0.99 1.09  Sodium 134 - 144 mmol/L 140 136  Potassium 3.5 - 5.2 mmol/L 4.4 3.8  Chloride 96 - 106 mmol/L 103 104  CO2 20 - 29 mmol/L 23 23  Calcium 8.7 - 10.2 mg/dL 9.5 9.1  Total Protein 6.0 - 8.5 g/dL 7.1 -  Total Bilirubin 0.0 - 1.2 mg/dL 0.4 -  Alkaline Phos 44 - 121 IU/L 79 -  AST 0 - 40 IU/L 22 -  ALT 0 - 44 IU/L 27 -    Lipid Panel     Component Value Date/Time   CHOL 198 12/17/2019 1043   TRIG 610 (HH) 12/17/2019 1043   HDL 38 (L) 12/17/2019 1043   LDLCALC 67 12/17/2019 1043   LDLDIRECT 61 12/17/2019 1043   LABVLDL 93 (H) 12/17/2019 1043    No components found for: NTPROBNP No results for input(s): PROBNP in the last 8760 hours. No results for input(s): TSH in the last 8760 hours.  BMP No results for input(s): NA, K, CL, CO2, GLUCOSE, BUN, CREATININE, CALCIUM, GFRNONAA, GFRAA in the last 8760 hours.   HEMOGLOBIN A1C No results found for: HGBA1C, MPG  External Labs: Collected: 07/19/2019 at [facility], obtained from care everywhere Creatinine 1.1 mg/dL. eGFR: 76 mL/min per 1.73 m Hemoglobin 15.5 g/dL TSH: 1.93  IMPRESSION:  ICD-10-CM   1. Hypertriglyceridemia  E78.1 EKG 12-Lead       RECOMMENDATIONS: Aaron Blanchard is a 55 y.o. male with past medical history and cardiovascular risk factors include hypertriglyceridemia, former smoker.  Patient presents for his annual visit.  He does not have any chest pain or shortness of breath at rest or with effort related activities.  He is overall physical endurance remains relatively stable.  He has undergone a recent ischemic work-up in the setting of chest pain which was overall unremarkable and reviewed as part of today's visit.  Patient states that he has had recent blood work with PCP I have asked him to have a copy forwarded to [place] for reference.  Patient is triglycerides are currently being managed by PCP and has  been started on medical therapy.  Medications have not been reconciled as he does not recall the name or dosage of medications.  No clear indication to be on aspirin 81 mg p.o. daily from a cardiovascular standpoint.  He may discontinue this was also encouraged at his last office visit.  We discussed seeing each other on a as needed basis; however, he would like to follow-up on an annual basis.  I have asked him to schedule his 1 year follow-up visit after his yearly physical and to bring in his labs and medication bottles with him for reference.  Patient is agreeable with the plan of care.  FINAL MEDICATION LIST END OF ENCOUNTER: No orders of the defined types were placed in this encounter.    Current Outpatient Medications:    aspirin EC 81 MG tablet, Take 81 mg by mouth daily as needed for moderate pain., Disp: , Rfl:    fenofibrate (TRICOR) 145 MG tablet, Take 1 tablet (145 mg total) by mouth daily., Disp: 90 tablet, Rfl: 0   omeprazole (PRILOSEC) 40 MG capsule, Take 1 capsule by mouth daily., Disp: , Rfl:   Orders Placed This Encounter  Procedures   EKG 12-Lead   There are no Patient Instructions on file for this visit.   --Continue cardiac medications as reconciled in final medication list. --Return in about 1 year (around 12/31/2021) for Annual follow-up after his yearly physical.. Or sooner if needed. --Continue follow-up with your primary care physician regarding the management of your other chronic comorbid conditions.  Patient's questions and concerns were addressed to his satisfaction. He voices understanding of the instructions provided during this encounter.   This note was created using a voice recognition software as a result there may be grammatical errors inadvertently enclosed that do not reflect the nature of this encounter. Every attempt is made to correct such errors.  Aaron Blanchard, [place], [facility]  Pager: [phone] Office: [phone]

## 2021-12-15 IMAGING — CT CT ANGIO CHEST
2 of 6 series · 18 of 36 positions shown · IV contrast (omnipaque)
Comparison: Chest radiograph July 20, 2019

CLINICAL DATA: Chest pain

EXAM:
CT ANGIOGRAPHY CHEST WITH CONTRAST
TECHNIQUE: Multidetector CT imaging of the chest was performed using the
standard protocol during bolus administration of intravenous
contrast. Multiplanar CT image reconstructions and MIPs were
obtained to evaluate the vascular anatomy.
CONTRAST:  100mL OMNIPAQUE IOHEXOL 350 MG/ML SOLN

[Series 5: thins · axial · 0.78mm/px · z∈[-294,-51]mm · 17 of 275 slices shown]
[im 16/275  lung]
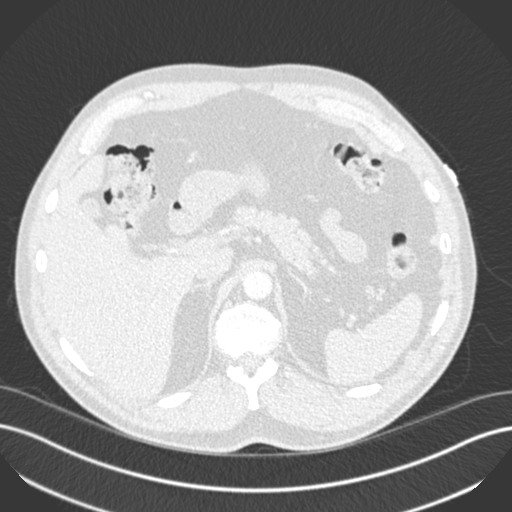
[im 31/275  mediastinal]
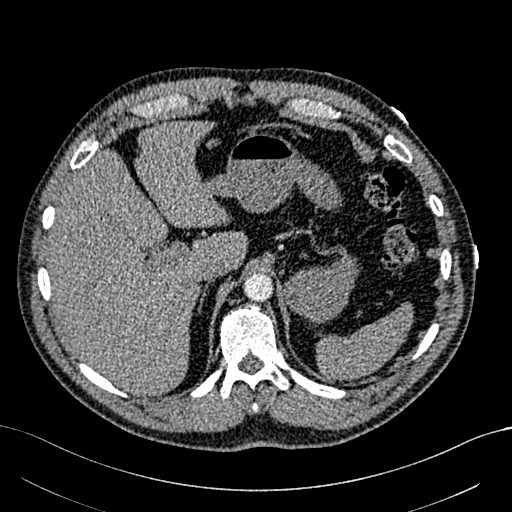
[im 46/275  lung]
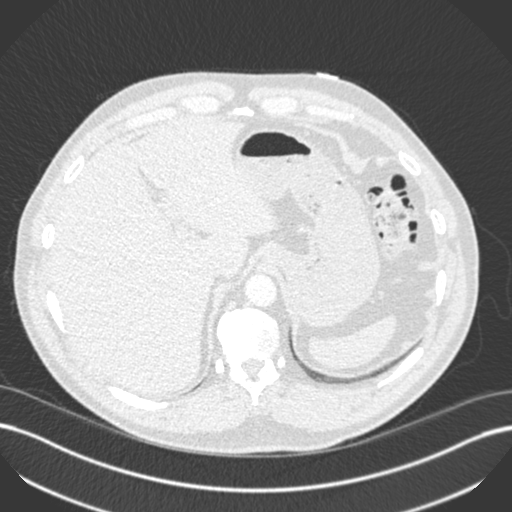
[im 61/275  mediastinal]
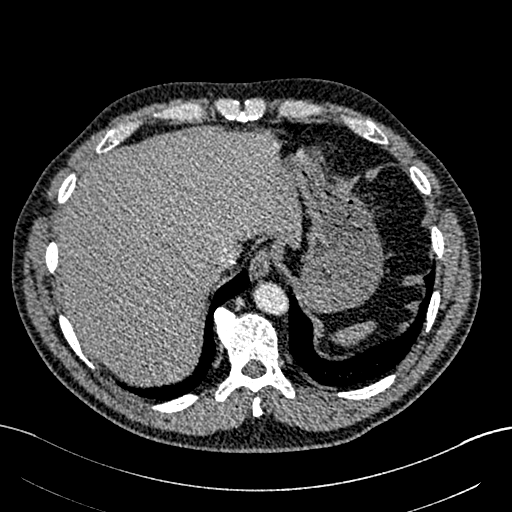
[im 77/275  lung]
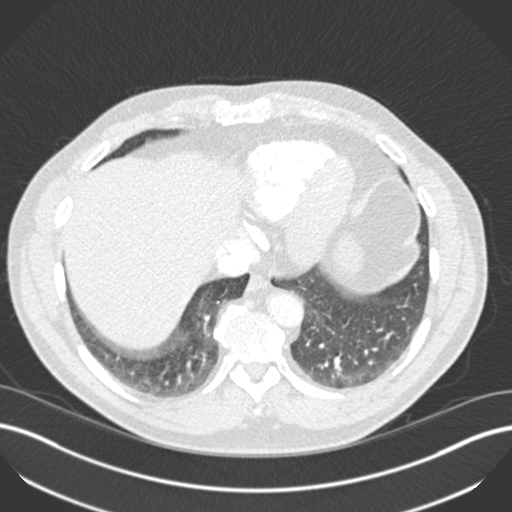
[im 92/275  mediastinal]
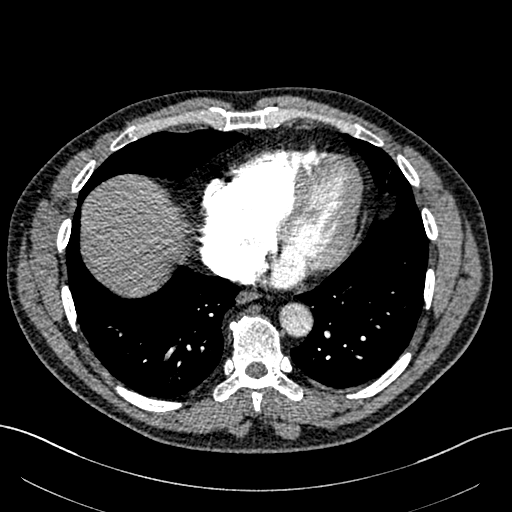
[im 107/275  lung]
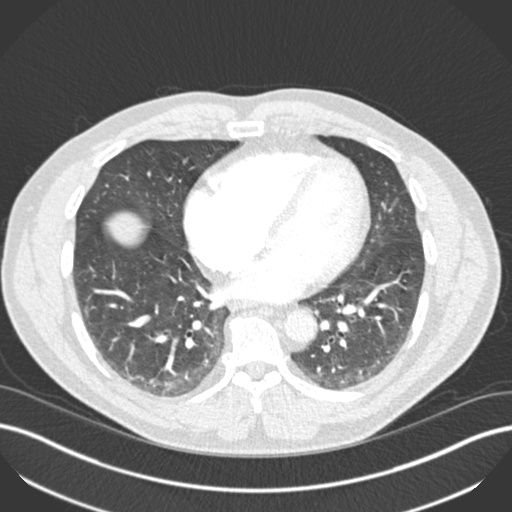
[im 122/275  mediastinal]
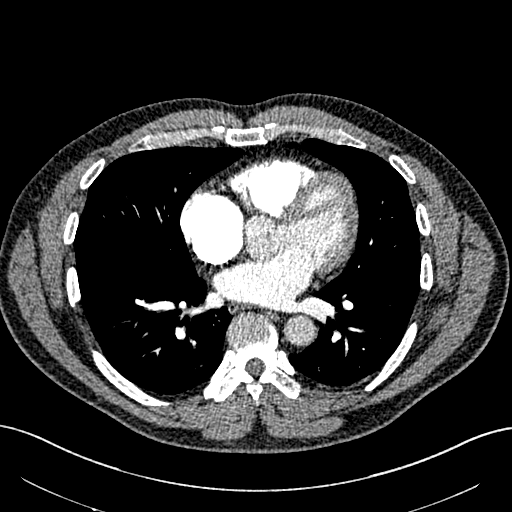
[im 138/275  lung]
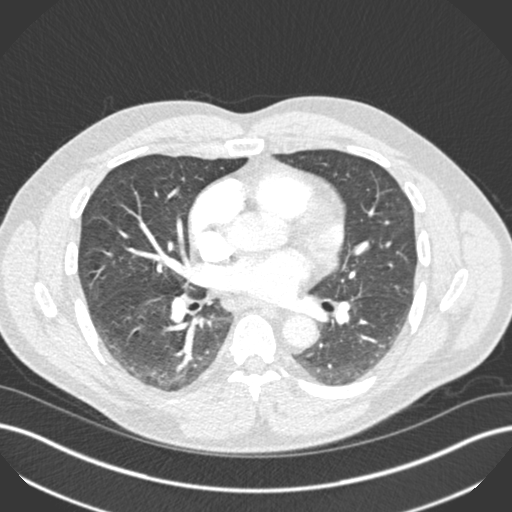
[im 153/275  mediastinal]
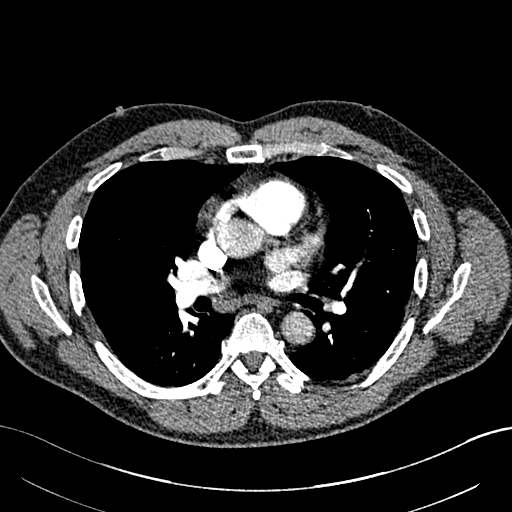
[im 168/275  lung]
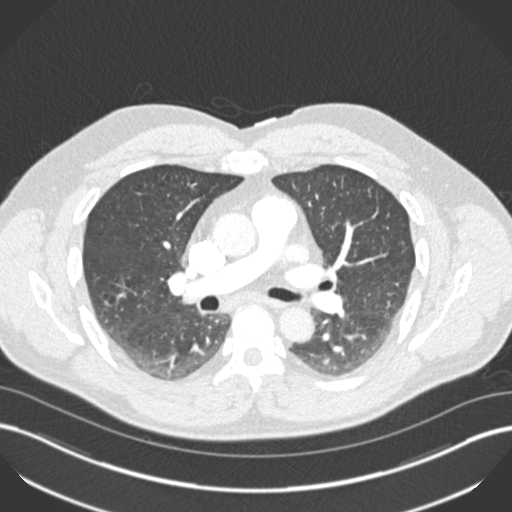
[im 183/275  mediastinal]
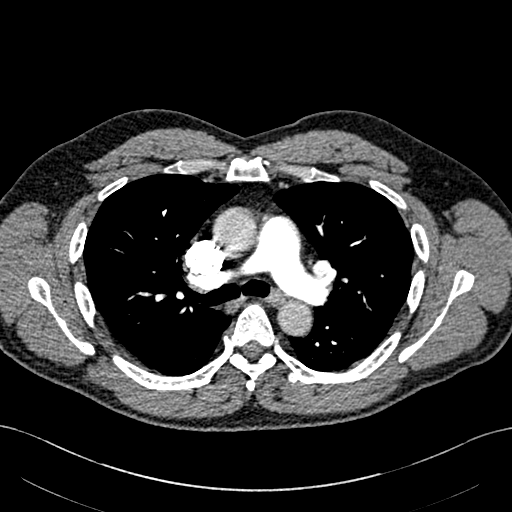
[im 198/275  lung]
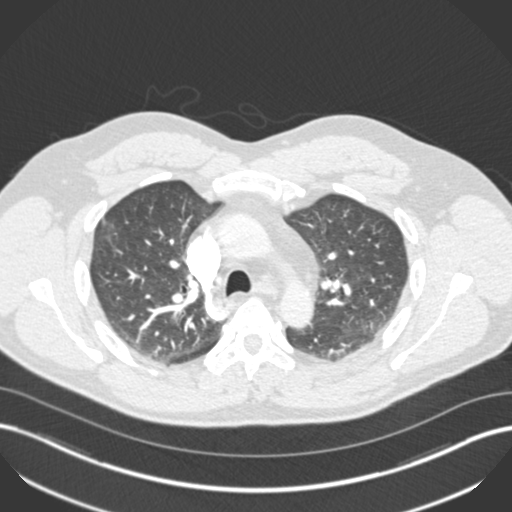
[im 214/275  mediastinal]
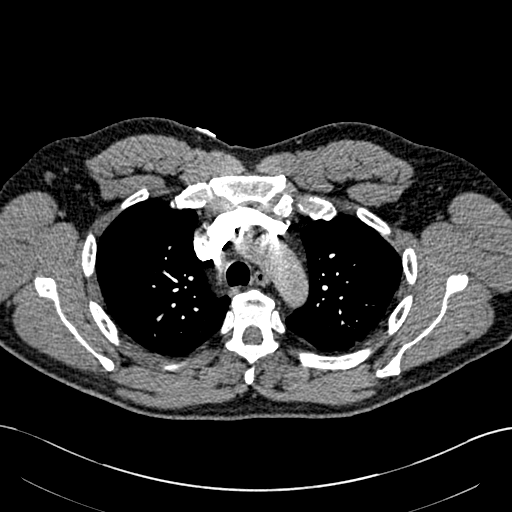
[im 229/275  lung]
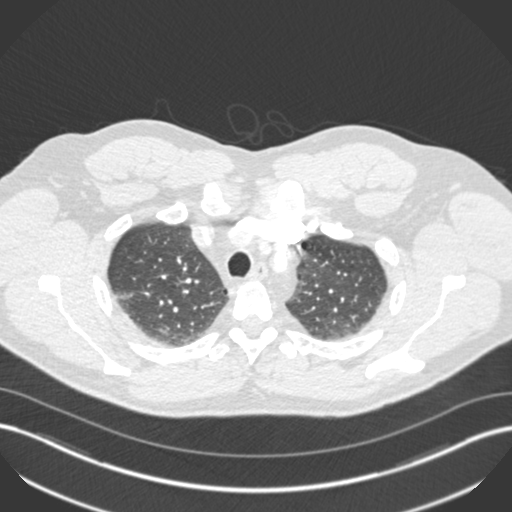
[im 244/275  mediastinal]
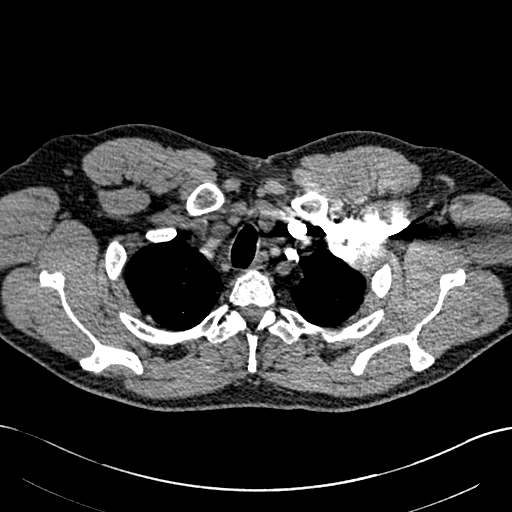
[im 259/275  lung]
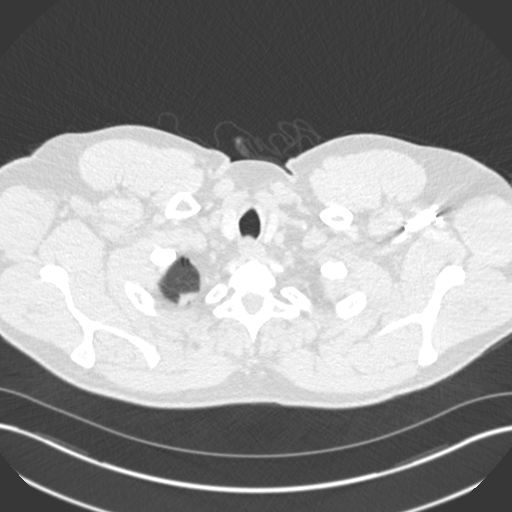

[Series 7: coronal mpr · coronal · 0.55mm/px · 1 of 151 slices shown]
[im 76/151  mediastinal]
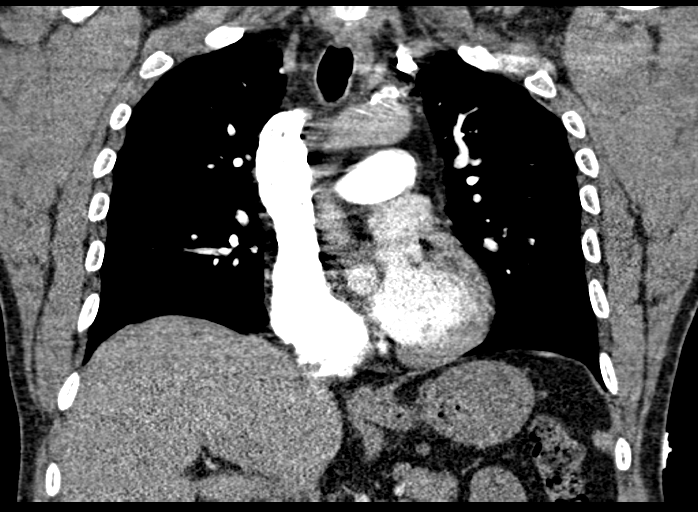

[18 of 36 positions shown; findings below may reference images not displayed]

FINDINGS: Cardiovascular: There is no demonstrable pulmonary embolus. There is
no thoracic aortic aneurysm. No thoracic aortic dissection evident;
note that the contrast bolus in the aorta is less than optimal for
potential dissection assessment. Visualized great vessels appear
unremarkable. There are foci of aortic atherosclerosis. No
pericardial effusion or pericardial thickening.

Mediastinum/Nodes: Thyroid appears unremarkable. No adenopathy
evident. No esophageal lesions.

Lungs/Pleura: There is patchy atelectasis in the lung bases as well
as in the inferior lingular region. No appreciable airspace
consolidation or pleural effusions.

Upper Abdomen: Visualized upper abdominal structures appear
unremarkable.

Musculoskeletal: There are foci of degenerative change in the
thoracic spine. No blastic or lytic bone lesions. No chest wall
lesions evident.

Review of the MIP images confirms the above findings.
IMPRESSION: 1. No demonstrable pulmonary embolus. No thoracic aortic aneurysm.
No dissection evident. Note that the contrast bolus in the aorta is
less than optimal for dissection assessment. There is aortic
atherosclerosis.

2. Areas of patchy atelectasis bilaterally. No edema or airspace
opacity. No pleural effusions.

3.  No evident adenopathy.

Aortic Atherosclerosis (SY2O8-FYY.Y).

## 2021-12-31 ENCOUNTER — Ambulatory Visit: Payer: Self-pay | Admitting: Cardiology

## 2023-09-20 ENCOUNTER — Ambulatory Visit: Payer: Self-pay | Attending: Cardiology | Admitting: Cardiology

## 2023-09-20 ENCOUNTER — Encounter: Payer: Self-pay | Admitting: Cardiology

## 2023-09-20 VITALS — BP 115/74 | HR 64 | Resp 16 | Ht 71.0 in | Wt 214.8 lb

## 2023-09-20 DIAGNOSIS — R7303 Prediabetes: Secondary | ICD-10-CM

## 2023-09-20 DIAGNOSIS — E781 Pure hyperglyceridemia: Secondary | ICD-10-CM

## 2023-09-20 DIAGNOSIS — M79602 Pain in left arm: Secondary | ICD-10-CM

## 2023-09-20 DIAGNOSIS — R072 Precordial pain: Secondary | ICD-10-CM

## 2023-09-20 MED ORDER — METOPROLOL TARTRATE 50 MG PO TABS: 50.0000 mg | ORAL_TABLET | Freq: Once | ORAL | 0 refills | Status: AC

## 2023-09-20 NOTE — Progress Notes (Signed)
 Cardiology Office Note:  .   Date:  09/20/2023  ID:  Aaron Blanchard, DOB Apr 28, 1965, MRN [id number] PCP:  Leila Lucie LABOR, MD  Former Cardiology Providers: NA [facility] Providers Cardiologist:  Madonna Large, DO , [facility] (established care 08/21/2019) Electrophysiologist:  None  Click to update primary MD,subspecialty MD or APP then REFRESH:1}    Chief Complaint  Patient presents with   Follow-up   Chest Pain   Left arm pain     History of Present Illness: .   Aaron Blanchard is a 58 y.o.  male whose past medical history and cardiovascular risk factors includes: hypertriglyceridemia, former smoker.   Patient was seen in the office in 2021 for evaluation of chest pain and dizziness.  He underwent ischemic evaluation at that time.  Patient was last seen in the office in November 2022 and presents today for reevaluation of chest pain and left arm pain.  In May 2025 patient decided to stop taking Prilosec and fenofibrate ; however, shortly thereafter when he had labs done in June he realized that his triglycerides were high and the medications were restarted.  He was referred back to cardiology due to chest pain and left arm pain.  Left arm pain: Last episode approximately 3 weeks ago. That episode lasted for approximately 4 days and the symptoms were self-limited. The pain was along the triceps region on the left arm. Due to prolonged ER wait she decided not to go to the ED at that time.  After discussing with PCP he was referred to cardiology. His chest pain appears intermittently.  Describes it as fluttering like sensation, 5 out of 10 in intensity, lasts for few seconds, substernally located, similar to his episodes back in 2021 but less in intensity.  The pain is not brought on by effort related activities and does not resolve with rest.  Patient has not been evaluated by GI for possible esophagitis/gastritis.  Review of Systems: .   Review of Systems  Cardiovascular:   Positive for chest pain (last event 3 weeks ago). Negative for claudication, irregular heartbeat, leg swelling, near-syncope, orthopnea, palpitations, paroxysmal nocturnal dyspnea and syncope.       Left arm pain  Respiratory:  Negative for shortness of breath.   Hematologic/Lymphatic: Negative for bleeding problem.  Gastrointestinal:  Positive for heartburn.    Studies Reviewed:   EKG: EKG Interpretation Date/Time:  Wednesday September 20 2023 09:31:01 EDT Ventricular Rate:  60 PR Interval:  148 QRS Duration:  88 QT Interval:  394 QTC Calculation: 394 R Axis:   70  Text Interpretation: Normal sinus rhythm Normal ECG When compared with ECG of 20-Jul-2019 14:36, No significant change since last tracing Confirmed by Large Madonna [phone]) on 09/20/2023 9:51:33 AM  Echocardiogram: 08/28/2019:  Normal LV systolic function with visual EF 55-60%. Left ventricle cavity is normal in size. Mild left ventricular hypertrophy. Normal global wall motion. Normal diastolic filling pattern, normal LAP. Calculated EF 55%.  Mild (Grade I) mitral regurgitation.  Mild tricuspid regurgitation.  No prior study for comparison.   Stress Testing: Exercise sestamibi stress test July 2021: Low risk  Coronary calcification scoring at [facility] 12/05/2019: Total coronary calcification score 0.  RADIOLOGY: N/A  Risk Assessment/Calculations:   NA   Labs:       Latest Ref Rng & Units 08/28/2019    2:09 PM 07/20/2019    2:35 PM 10/21/2015    4:22 PM  CBC  WBC 3.4 - 10.8 x10E3/uL 6.3  6.3  6.0  Hemoglobin 13.0 - 17.7 g/dL 84.4  84.3  84.3   Hematocrit 37.5 - 51.0 % 45.5  45.6  45.7   Platelets 150 - 450 x10E3/uL 256  240  230        Latest Ref Rng & Units 12/17/2019   10:48 AM 07/20/2019    2:35 PM  BMP  Glucose 65 - 99 mg/dL 885  868   BUN 6 - 24 mg/dL 9  17   Creatinine 9.23 - 1.27 mg/dL 9.00  8.90   BUN/Creat Ratio 9 - 20 9    Sodium 134 - 144 mmol/L 140  136   Potassium 3.5 - 5.2 mmol/L 4.4   3.8   Chloride 96 - 106 mmol/L 103  104   CO2 20 - 29 mmol/L 23  23   Calcium 8.7 - 10.2 mg/dL 9.5  9.1       Latest Ref Rng & Units 12/17/2019   10:48 AM 07/20/2019    2:35 PM  CMP  Glucose 65 - 99 mg/dL 885  868   BUN 6 - 24 mg/dL 9  17   Creatinine 9.23 - 1.27 mg/dL 9.00  8.90   Sodium 865 - 144 mmol/L 140  136   Potassium 3.5 - 5.2 mmol/L 4.4  3.8   Chloride 96 - 106 mmol/L 103  104   CO2 20 - 29 mmol/L 23  23   Calcium 8.7 - 10.2 mg/dL 9.5  9.1   Total Protein 6.0 - 8.5 g/dL 7.1    Total Bilirubin 0.0 - 1.2 mg/dL 0.4    Alkaline Phos 44 - 121 IU/L 79    AST 0 - 40 IU/L 22    ALT 0 - 44 IU/L 27      Lab Results  Component Value Date   CHOL 198 12/17/2019   HDL 38 (L) 12/17/2019   LDLCALC 67 12/17/2019   LDLDIRECT 61 12/17/2019   TRIG 610 (HH) 12/17/2019       Physical Exam:    Today's Vitals   09/20/23 0927  BP: 115/74  Pulse: 64  Resp: 16  SpO2: 97%  Weight: 214 lb 12.8 oz (97.4 kg)  Height: 5' 11 (1.803 m)   Body mass index is 29.96 kg/m. Wt Readings from Last 3 Encounters:  09/20/23 214 lb 12.8 oz (97.4 kg)  12/31/20 206 lb 3.2 oz (93.5 kg)  12/06/19 214 lb (97.1 kg)    Physical Exam  Constitutional: No distress.  hemodynamically stable  Neck: No JVD present.  Cardiovascular: Normal rate, regular rhythm, S1 normal and S2 normal. Exam reveals no gallop, no S3 and no S4.  No murmur heard. Pulmonary/Chest: Effort normal and breath sounds normal. No stridor. He has no wheezes. He has no rales.  Musculoskeletal:        General: No edema.     Cervical back: Neck supple.  Skin: Skin is warm.     Impression & Recommendation(s):  Impression:   ICD-10-CM   1. Precordial pain  R07.2 ECHOCARDIOGRAM COMPLETE    CT CORONARY MORPH W/CTA COR W/SCORE W/CA W/CM &/OR WO/CM    metoprolol  tartrate (LOPRESSOR ) 50 MG tablet    2. Left arm pain  M79.602     3. Hypertriglyceridemia  E78.1 EKG 12-Lead    Comprehensive metabolic panel with GFR    Lipid  panel    Comprehensive metabolic panel with GFR    Lipid panel    4. Prediabetes  R73.03  Recommendation(s):  Precordial pain Left arm pain No active discomfort. EKG is nonischemic. Echo will be ordered to evaluate for structural heart disease and left ventricular systolic function. Coronary CTA to evaluate for CAC, plaque burden, and obstructive disease Lopressor  50 mg x 1 2 hours prior to the scan Educated him on seeking medical attention sooner by going to the closest ER via EMS if the symptoms increase in intensity, frequency, duration, or has typical chest pain as discussed in the office.  Patient verbalized understanding.  Hypertriglyceridemia Prolonged history of hypertriglyceridemia. His last lipids were checked in June 2025 which noted a triglyceride level of 533, this value may have been skewed as he stopped taking fenofibrate  in May 2025. However when he was taking fenofibrate  in the past triglyceride levels had only improved up to the mid 300s. Reemphasized importance of reducing alcohol consumption-once a month he usually has 5-6 shots of liquor along with few beers. Re emphasized the importance of reducing carbohydrate intake-breakfast sandwiches, rice, and frequently eating out. Will check fasting lipids and CMP. If triglyceride levels are still not well-controlled we will consider transitioning him off of fenofibrate  onto Vascepa 1 g 2 tablets twice daily.  Patient verbalizes no allergies.  Prediabetes Reemphasized the importance of glycemic control. Follow-up with PCP  Medical decision making: At least moderate.  Discussed management of at least 2 chronic comorbid conditions. Ordered additional diagnostic workup for ischemia evaluation including echo, coronary CTA, EKG Outside labs from August 02, 2023 independently reviewed via Care Everywhere and also discussed with patient Prescription drug management PCP progress note 08/30/2023 Discussed symptoms that we  will need an expedited evaluation by going to the closest ER via EMS.  Orders Placed:  Orders Placed This Encounter  Procedures   CT CORONARY MORPH W/CTA COR W/SCORE W/CA W/CM &/OR WO/CM    Standing Status:   Future    Expected Date:   10/04/2023    Expiration Date:   09/19/2024    If indicated for the ordered procedure, I authorize the administration of contrast media per Radiology protocol:   Yes    Initiate Coronary CTA Adult Protocol:   Yes    If indicated initiate Post Coronary CTA Hypotension Adult Protocol:   Yes    Does the patient have a contrast media/X-ray dye allergy?:   No    [facility]    Authorization::   FFR will be ordered if deemed medically necessary   Comprehensive metabolic panel with GFR    Standing Status:   Future    Number of Occurrences:   1    Expected Date:   09/21/2023    Expiration Date:   09/19/2024   Lipid panel    Standing Status:   Future    Number of Occurrences:   1    Expected Date:   09/21/2023    Expiration Date:   09/19/2024   EKG 12-Lead   ECHOCARDIOGRAM COMPLETE    Standing Status:   Future    Expected Date:   09/27/2023    Expiration Date:   09/19/2024    Where [facility] this [facility] performed[facility]    Does the patient weigh less than or greater than 250 lbs?:   Patient weighs less than 250 lbs             214 lbs    Perflutren DEFINITY (image enhancing agent) should be administered unless hypersensitivity or allergy exist:  Administer Perflutren    Reason for exam-Echo:   Other-Full Diagnosis List    Full ICD-10/Reason for Exam:   Precordial pain [786.51.ICD-9-CM]     Final Medication List:    Meds ordered this encounter  Medications   metoprolol  tartrate (LOPRESSOR ) 50 MG tablet    Sig: Take 1 tablet (50 mg total) by mouth once for 1 dose. Take 90-120 minutes prior to scan. Hold for systolic blood pressure (top number) less than 100 and/ or heart rate less than 55.     Dispense:  1 tablet    Refill:  0    Medications Discontinued During This Encounter  Medication Reason   aspirin EC 81 MG tablet Patient Preference     Current Outpatient Medications:    fenofibrate  (TRICOR ) 145 MG tablet, Take 1 tablet (145 mg total) by mouth daily., Disp: 90 tablet, Rfl: 0   metoprolol  tartrate (LOPRESSOR ) 50 MG tablet, Take 1 tablet (50 mg total) by mouth once for 1 dose. Take 90-120 minutes prior to scan. Hold for systolic blood pressure (top number) less than 100 and/ or heart rate less than 55., Disp: 1 tablet, Rfl: 0   omeprazole  (PRILOSEC) 40 MG capsule, Take 1 capsule by mouth daily., Disp: , Rfl:   Consent:   NA  Disposition:   6 months sooner if needed  His questions and concerns were addressed to his satisfaction. He voices understanding of the recommendations provided during this encounter.    Signed, Madonna Michele HAS, [facility]  A Division of [facility] [address], [place], [place] [address]  [place], [place] [address] 09/20/2023 11:10 AM

## 2023-09-20 NOTE — Patient Instructions (Signed)
 Medication Instructions:  ONE TIME DOSE of Metoprolol  Tartrate (Lopressor ) 50 mg 2 hours prior to coronary CTA scan.  *If you need a refill on your cardiac medications before your next appointment, please call your pharmacy*  Lab Work: To be completed tomorrow: FASTING lipid panel and CMP  If you have labs (blood work) drawn today and your tests are completely normal, you will receive your results only by: MyChart Message (if you have MyChart) OR A paper copy in the mail If you have any lab test that is abnormal or we need to change your treatment, we will call you to review the results.  Testing/Procedures: Your physician has requested that you have an echocardiogram. Echocardiography is a painless test that uses sound waves to create images of your heart. It provides your doctor with information about the size and shape of your heart and how well your heart's chambers and valves are working. This procedure takes approximately one hour. There are no restrictions for this procedure. Please do NOT wear cologne, perfume, aftershave, or lotions (deodorant is allowed). Please arrive 15 minutes prior to your appointment time.  Please note: We ask at that you not bring children with you during ultrasound (echo/ vascular) testing. Due to room size and safety concerns, children are not allowed in the ultrasound rooms during exams. Our front office staff cannot provide observation of children in our lobby area while testing is being conducted. An adult accompanying a patient to their appointment will only be allowed in the ultrasound room at the discretion of the ultrasound technician under special circumstances. We apologize for any inconvenience.   Follow-Up: At [facility], you and your health needs are our priority.  As part of our continuing mission to provide you with exceptional heart care, our providers are all part of one team.  This team includes your primary Cardiologist (physician)  and Advanced Practice Providers or APPs (Physician Assistants and Nurse Practitioners) who all work together to provide you with the care you need, when you need it.  Your next appointment:   6 month(s)  Provider:   Madonna Large, DO    We recommend signing up for the patient portal called MyChart.  Sign up information is provided on this After Visit Summary.  MyChart is used to connect with patients for Virtual Visits (Telemedicine).  Patients are able to view lab/test results, encounter notes, upcoming appointments, etc.  Non-urgent messages can be sent to your provider as well.   To learn more about what you can do with MyChart, go to [email or web address].   Other Instructions   Your cardiac CT will be scheduled at one of the below locations:   [facility] [address] [place], [place] [address] [phone]   Please follow these instructions carefully (unless otherwise directed):  An IV will be required for this test and Nitroglycerin will be given.  Hold all erectile dysfunction medications at least 3 days (72 hrs) prior to test. (Ie viagra, cialis, sildenafil, tadalafil, etc)   On the Night Before the Test: Be sure to Drink plenty of water. Do not consume any caffeinated/decaffeinated beverages or chocolate 12 hours prior to your test. Do not take any antihistamines 12 hours prior to your test.  On the Day of the Test: Drink plenty of water until 1 hour prior to the test. Do not eat any food 1 hour prior to test. You may take your regular medications prior to the test.  Take metoprolol  (Lopressor )  50 mg two hours prior to test.      After the Test: Drink plenty of water. After receiving IV contrast, you may experience a mild flushed feeling. This is normal. On occasion, you may experience a mild rash up to 24 hours after the test. This is not dangerous. If this occurs, you can take Benadryl 25 mg and increase your fluid intake. If you  experience trouble breathing, this can be serious. If it is severe call 911 IMMEDIATELY. If it is mild, please call our office.  We will call to schedule your test 2-4 weeks out understanding that some insurance companies will need an authorization prior to the service being performed.   For more information and frequently asked questions, please visit our website : [email or web address]  For non-scheduling related questions, please contact the cardiac imaging nurse navigator should you have any questions/concerns: Cardiac Imaging Nurse Navigators Direct Office Dial: [phone]   For scheduling needs, including cancellations and rescheduling, please call [place], [phone].

## 2023-09-22 LAB — COMPREHENSIVE METABOLIC PANEL WITH GFR
ALT: 29 IU/L (ref 0–44)
AST: 21 IU/L (ref 0–40)
Albumin: 4 g/dL (ref 3.8–4.9)
Alkaline Phosphatase: 69 IU/L (ref 44–121)
BUN/Creatinine Ratio: 21 — ABNORMAL HIGH (ref 9–20)
BUN: 20 mg/dL (ref 6–24)
Bilirubin Total: 0.4 mg/dL (ref 0.0–1.2)
CO2: 21 mmol/L (ref 20–29)
Calcium: 9.1 mg/dL (ref 8.7–10.2)
Chloride: 105 mmol/L (ref 96–106)
Creatinine, Ser: 0.94 mg/dL (ref 0.76–1.27)
Globulin, Total: 2.7 g/dL (ref 1.5–4.5)
Glucose: 99 mg/dL (ref 70–99)
Potassium: 4.5 mmol/L (ref 3.5–5.2)
Sodium: 142 mmol/L (ref 134–144)
Total Protein: 6.7 g/dL (ref 6.0–8.5)
eGFR: 95 mL/min/1.73 (ref >59)

## 2023-09-22 LAB — LIPID PANEL
Chol/HDL Ratio: 3.4 ratio (ref 0.0–5.0)
Cholesterol, Total: 118 mg/dL (ref 100–199)
HDL: 35 mg/dL — ABNORMAL LOW (ref >39)
LDL Chol Calc (NIH): 48 mg/dL (ref 0–99)
Triglycerides: 222 mg/dL — ABNORMAL HIGH (ref 0–149)
VLDL Cholesterol Cal: 35 mg/dL (ref 5–40)

## 2023-09-25 ENCOUNTER — Ambulatory Visit: Payer: Self-pay | Admitting: Cardiology

## 2023-09-25 DIAGNOSIS — E781 Pure hyperglyceridemia: Secondary | ICD-10-CM

## 2023-10-09 MED ORDER — ICOSAPENT ETHYL 1 G PO CAPS: 1.0000 g | ORAL_CAPSULE | Freq: Two times a day (BID) | ORAL | 3 refills | Status: DC

## 2023-10-11 ENCOUNTER — Encounter (HOSPITAL_COMMUNITY): Payer: Self-pay

## 2023-10-25 ENCOUNTER — Ambulatory Visit (HOSPITAL_COMMUNITY)
Admission: RE | Admit: 2023-10-25 | Discharge: 2023-10-25 | Disposition: A | Discharge status: Home / Self Care | Payer: Self-pay | Source: Ambulatory Visit | Attending: Cardiology | Admitting: Cardiology

## 2023-10-25 DIAGNOSIS — R072 Precordial pain: Secondary | ICD-10-CM | POA: Insufficient documentation

## 2023-10-25 MED ORDER — NITROGLYCERIN 0.4 MG SL SUBL
0.8000 mg | SUBLINGUAL_TABLET | Freq: Once | SUBLINGUAL | Status: AC
  Administered 2023-10-25: 0.8 mg via SUBLINGUAL

## 2023-10-25 MED ORDER — IOHEXOL 350 MG/ML SOLN
100.0000 mL | Freq: Once | INTRAVENOUS | Status: AC | PRN
  Administered 2023-10-25: 100 mL via INTRAVENOUS

## 2023-10-26 ENCOUNTER — Ambulatory Visit (HOSPITAL_COMMUNITY)
Admission: RE | Admit: 2023-10-26 | Discharge: 2023-10-26 | Disposition: A | Discharge status: Home / Self Care | Payer: Self-pay | Source: Ambulatory Visit | Attending: Cardiology | Admitting: Cardiology

## 2023-10-26 DIAGNOSIS — R072 Precordial pain: Secondary | ICD-10-CM | POA: Insufficient documentation

## 2023-10-26 LAB — ECHOCARDIOGRAM COMPLETE
Area-P 1/2: 3.54 cm2
S' Lateral: 2.8 cm

## 2023-11-14 ENCOUNTER — Telehealth: Payer: Self-pay | Admitting: Cardiology

## 2023-11-14 NOTE — Telephone Encounter (Signed)
 I called the patient and gave his ECHO results. He was appreciative for the call back.

## 2023-11-14 NOTE — Telephone Encounter (Signed)
 Pt calling back regarding results

## 2024-01-04 ENCOUNTER — Other Ambulatory Visit: Payer: Self-pay | Admitting: Cardiology

## 2024-01-04 DIAGNOSIS — E781 Pure hyperglyceridemia: Secondary | ICD-10-CM
